# Patient Record
Sex: Female | Born: 2000 | Race: Black or African American | Hispanic: No | Marital: Single | Smoking: Never smoker
Health system: Southern US, Community
[De-identification: ages and names within clinical notes are randomized; demographics above are authoritative.]

---
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
## 2019-07-06 ENCOUNTER — Other Ambulatory Visit: Payer: Self-pay | Admitting: Nurse Practitioner

## 2019-07-06 ENCOUNTER — Other Ambulatory Visit: Payer: Self-pay | Admitting: Registered Nurse

## 2019-07-06 DIAGNOSIS — M79602 Pain in left arm: Secondary | ICD-10-CM

## 2019-07-06 DIAGNOSIS — M25512 Pain in left shoulder: Secondary | ICD-10-CM

## 2019-07-11 ENCOUNTER — Other Ambulatory Visit: Payer: Self-pay | Admitting: Nurse Practitioner

## 2019-07-17 ENCOUNTER — Inpatient Hospital Stay: Admission: RE | Admit: 2019-07-17 | Payer: BC Managed Care – PPO | Source: Ambulatory Visit

## 2019-07-17 ENCOUNTER — Inpatient Hospital Stay: Admission: RE | Admit: 2019-07-17 | Payer: BLUE CROSS/BLUE SHIELD | Source: Ambulatory Visit

## 2019-09-20 ENCOUNTER — Ambulatory Visit: Payer: BC Managed Care – PPO | Attending: Family

## 2019-09-20 DIAGNOSIS — Z23 Encounter for immunization: Secondary | ICD-10-CM

## 2019-09-20 NOTE — Progress Notes (Signed)
   Covid-19 Vaccination Clinic  Name:  Veola Cafaro    MRN: [id number] DOB: 08-Dec-2000  09/20/2019  Ms. Lavoy was observed post Covid-19 immunization for 15 minutes without incident. She was provided with Vaccine Information Sheet and instruction to access the V-Safe system.   Ms. Brandenburger was instructed to call 911 with any severe reactions post vaccine: Marland Kitchen Difficulty breathing  . Swelling of face and throat  . A fast heartbeat  . A bad rash all over body  . Dizziness and weakness   Immunizations Administered    Name Date Dose VIS Date Route   Moderna COVID-19 Vaccine 09/20/2019  1:34 PM 0.5 mL 04/2019 Intramuscular   Manufacturer: Moderna   Lot: [id number]   NDC: 37628-315-17

## 2019-10-16 ENCOUNTER — Ambulatory Visit: Payer: BC Managed Care – PPO

## 2019-10-18 ENCOUNTER — Ambulatory Visit: Payer: BC Managed Care – PPO | Attending: Family

## 2019-10-18 DIAGNOSIS — Z23 Encounter for immunization: Secondary | ICD-10-CM

## 2019-10-18 NOTE — Progress Notes (Signed)
   Covid-19 Vaccination Clinic  Name:  Colleen Turner    MRN: [id number] DOB: 2001-05-21  10/18/2019  Colleen Turner was observed post Covid-19 immunization for 15 minutes without incident. She was provided with Vaccine Information Sheet and instruction to access the V-Safe system.   Colleen Turner was instructed to call 911 with any severe reactions post vaccine: Marland Kitchen Difficulty breathing  . Swelling of face and throat  . A fast heartbeat  . A bad rash all over body  . Dizziness and weakness   Immunizations Administered    Name Date Dose VIS Date Route   Moderna COVID-19 Vaccine 10/18/2019  1:08 PM 0.5 mL 04/2019 Intramuscular   Manufacturer: Moderna   Lot: [id number]   NDC: 90211-155-20

## 2019-11-08 ENCOUNTER — Ambulatory Visit: Payer: Self-pay

## 2020-08-30 ENCOUNTER — Emergency Department (HOSPITAL_COMMUNITY): Payer: BC Managed Care – PPO | Admitting: Registered Nurse

## 2020-08-30 ENCOUNTER — Observation Stay (HOSPITAL_COMMUNITY)
Admission: EM | Admit: 2020-08-30 | Discharge: 2020-08-31 | Disposition: A | Discharge status: Home / Self Care | Payer: BC Managed Care – PPO | Attending: Surgery | Admitting: Surgery

## 2020-08-30 ENCOUNTER — Encounter (HOSPITAL_COMMUNITY): Payer: Self-pay

## 2020-08-30 ENCOUNTER — Emergency Department (HOSPITAL_COMMUNITY): Payer: BC Managed Care – PPO

## 2020-08-30 ENCOUNTER — Encounter (HOSPITAL_COMMUNITY)
Admission: EM | Disposition: A | Discharge status: Home / Self Care | Payer: Self-pay | Source: Home / Self Care | Attending: Emergency Medicine

## 2020-08-30 DIAGNOSIS — Z720 Tobacco use: Secondary | ICD-10-CM

## 2020-08-30 DIAGNOSIS — R1031 Right lower quadrant pain: Principal | ICD-10-CM | POA: Insufficient documentation

## 2020-08-30 DIAGNOSIS — F17210 Nicotine dependence, cigarettes, uncomplicated: Secondary | ICD-10-CM | POA: Diagnosis not present

## 2020-08-30 DIAGNOSIS — Z880 Allergy status to penicillin: Secondary | ICD-10-CM | POA: Insufficient documentation

## 2020-08-30 DIAGNOSIS — Z87898 Personal history of other specified conditions: Secondary | ICD-10-CM

## 2020-08-30 DIAGNOSIS — F418 Other specified anxiety disorders: Secondary | ICD-10-CM | POA: Diagnosis not present

## 2020-08-30 DIAGNOSIS — K3580 Unspecified acute appendicitis: Secondary | ICD-10-CM | POA: Diagnosis not present

## 2020-08-30 DIAGNOSIS — K3589 Other acute appendicitis without perforation or gangrene: Secondary | ICD-10-CM | POA: Diagnosis present

## 2020-08-30 DIAGNOSIS — F419 Anxiety disorder, unspecified: Secondary | ICD-10-CM

## 2020-08-30 DIAGNOSIS — Z8659 Personal history of other mental and behavioral disorders: Secondary | ICD-10-CM | POA: Diagnosis not present

## 2020-08-30 DIAGNOSIS — Z20822 Contact with and (suspected) exposure to covid-19: Secondary | ICD-10-CM | POA: Insufficient documentation

## 2020-08-30 DIAGNOSIS — Z79899 Other long term (current) drug therapy: Secondary | ICD-10-CM | POA: Insufficient documentation

## 2020-08-30 HISTORY — PX: LAPAROSCOPIC APPENDECTOMY: SHX408

## 2020-08-30 LAB — COMPREHENSIVE METABOLIC PANEL
ALT: 13 U/L (ref 0–44)
AST: 22 U/L (ref 15–41)
Albumin: 4 g/dL (ref 3.5–5.0)
Alkaline Phosphatase: 42 U/L (ref 38–126)
Anion gap: 8 (ref 5–15)
BUN: 14 mg/dL (ref 6–20)
CO2: 24 mmol/L (ref 22–32)
Calcium: 9.3 mg/dL (ref 8.9–10.3)
Chloride: 107 mmol/L (ref 98–111)
Creatinine, Ser: 0.76 mg/dL (ref 0.44–1.00)
GFR, Estimated: >60 mL/min (ref >60)
Glucose, Bld: 83 mg/dL (ref 70–99)
Potassium: 4.3 mmol/L (ref 3.5–5.1)
Sodium: 139 mmol/L (ref 135–145)
Total Bilirubin: 1 mg/dL (ref 0.3–1.2)
Total Protein: 7.4 g/dL (ref 6.5–8.1)

## 2020-08-30 LAB — CBC WITH DIFFERENTIAL/PLATELET
Abs Immature Granulocytes: 0.02 10*3/uL (ref 0.00–0.07)
Basophils Absolute: 0.1 10*3/uL (ref 0.0–0.1)
Basophils Relative: 1 %
Eosinophils Absolute: 0.2 10*3/uL (ref 0.0–0.5)
Eosinophils Relative: 3 %
HCT: 39 % (ref 36.0–46.0)
Hemoglobin: 12.4 g/dL (ref 12.0–15.0)
Immature Granulocytes: 0 %
Lymphocytes Relative: 24 %
Lymphs Abs: 1.7 10*3/uL (ref 0.7–4.0)
MCH: 23.1 pg — ABNORMAL LOW (ref 26.0–34.0)
MCHC: 31.8 g/dL (ref 30.0–36.0)
MCV: 72.6 fL — ABNORMAL LOW (ref 80.0–100.0)
Monocytes Absolute: 0.5 10*3/uL (ref 0.1–1.0)
Monocytes Relative: 6 %
Neutro Abs: 4.6 10*3/uL (ref 1.7–7.7)
Neutrophils Relative %: 66 %
Platelets: 230 10*3/uL (ref 150–400)
RBC: 5.37 MIL/uL — ABNORMAL HIGH (ref 3.87–5.11)
RDW: 13.7 % (ref 11.5–15.5)
WBC: 7 10*3/uL (ref 4.0–10.5)
nRBC: 0 % (ref 0.0–0.2)

## 2020-08-30 LAB — URINALYSIS, ROUTINE W REFLEX MICROSCOPIC
Bilirubin Urine: NEGATIVE
Glucose, UA: NEGATIVE mg/dL
Hgb urine dipstick: NEGATIVE
Ketones, ur: 80 mg/dL — AB
Leukocytes,Ua: NEGATIVE
Nitrite: NEGATIVE
Protein, ur: NEGATIVE mg/dL
Specific Gravity, Urine: 1.033 — ABNORMAL HIGH (ref 1.005–1.030)
pH: 5 (ref 5.0–8.0)

## 2020-08-30 LAB — I-STAT BETA HCG BLOOD, ED (MC, WL, AP ONLY): I-stat hCG, quantitative: <5 m[IU]/mL (ref <5)

## 2020-08-30 LAB — RESP PANEL BY RT-PCR (FLU A&B, COVID) ARPGX2
Influenza A by PCR: NEGATIVE
Influenza B by PCR: NEGATIVE
SARS Coronavirus 2 by RT PCR: NEGATIVE

## 2020-08-30 SURGERY — APPENDECTOMY, LAPAROSCOPIC
Anesthesia: General

## 2020-08-30 MED ORDER — GENTAMICIN SULFATE 40 MG/ML IJ SOLN
5.0000 mg/kg | INTRAVENOUS | Status: AC
  Administered 2020-08-30: 350 mg via INTRAVENOUS
  Filled 2020-08-30: qty 8.75

## 2020-08-30 MED ORDER — CELECOXIB 200 MG PO CAPS: 200.0000 mg | ORAL_CAPSULE | ORAL | Status: DC

## 2020-08-30 MED ORDER — MAGIC MOUTHWASH
15.0000 mL | Freq: Four times a day (QID) | ORAL | Status: DC | PRN
  Filled 2020-08-30: qty 15

## 2020-08-30 MED ORDER — OXYCODONE HCL 5 MG/5ML PO SOLN
5.0000 mg | Freq: Once | ORAL | Status: DC | PRN
Start: 2020-08-30 — End: 2020-08-30

## 2020-08-30 MED ORDER — FENTANYL CITRATE (PF) 250 MCG/5ML IJ SOLN
INTRAMUSCULAR | Status: AC
  Filled 2020-08-30: qty 5

## 2020-08-30 MED ORDER — CHLORHEXIDINE GLUCONATE CLOTH 2 % EX PADS: 6.0000 | MEDICATED_PAD | Freq: Once | CUTANEOUS | Status: DC

## 2020-08-30 MED ORDER — TRAMADOL HCL 50 MG PO TABS
50.0000 mg | ORAL_TABLET | Freq: Four times a day (QID) | ORAL | Status: DC | PRN
  Administered 2020-08-31 (×3): 50 mg via ORAL
  Filled 2020-08-30 (×3): qty 1

## 2020-08-30 MED ORDER — PHENOL 1.4 % MT LIQD
2.0000 | OROMUCOSAL | Status: DC | PRN
  Filled 2020-08-30: qty 177

## 2020-08-30 MED ORDER — LIDOCAINE 2% (20 MG/ML) 5 ML SYRINGE
INTRAMUSCULAR | Status: DC | PRN
  Administered 2020-08-30: 40 mg via INTRAVENOUS
  Administered 2020-08-30: 50 mg via INTRAVENOUS

## 2020-08-30 MED ORDER — BUPIVACAINE-EPINEPHRINE 0.25% -1:200000 IJ SOLN
INTRAMUSCULAR | Status: DC | PRN
  Administered 2020-08-30: 30 mL

## 2020-08-30 MED ORDER — DIPHENHYDRAMINE HCL 12.5 MG/5ML PO ELIX: 12.5000 mg | ORAL_SOLUTION | Freq: Four times a day (QID) | ORAL | Status: DC | PRN

## 2020-08-30 MED ORDER — FENTANYL CITRATE (PF) 250 MCG/5ML IJ SOLN
INTRAMUSCULAR | Status: DC | PRN
  Administered 2020-08-30 (×2): 25 ug via INTRAVENOUS
  Administered 2020-08-30 (×2): 100 ug via INTRAVENOUS

## 2020-08-30 MED ORDER — BUPIVACAINE LIPOSOME 1.3 % IJ SUSP
20.0000 mL | Freq: Once | INTRAMUSCULAR | Status: DC
  Filled 2020-08-30: qty 20

## 2020-08-30 MED ORDER — METRONIDAZOLE IN NACL 5-0.79 MG/ML-% IV SOLN
500.0000 mg | Freq: Three times a day (TID) | INTRAVENOUS | Status: DC
  Administered 2020-08-31 (×2): 500 mg via INTRAVENOUS
  Filled 2020-08-30 (×2): qty 100

## 2020-08-30 MED ORDER — SUGAMMADEX SODIUM 200 MG/2ML IV SOLN
INTRAVENOUS | Status: DC | PRN
  Administered 2020-08-30: 200 mg via INTRAVENOUS

## 2020-08-30 MED ORDER — ONDANSETRON HCL 4 MG/2ML IJ SOLN
4.0000 mg | Freq: Once | INTRAMUSCULAR | Status: AC
  Administered 2020-08-30: 4 mg via INTRAVENOUS
  Filled 2020-08-30: qty 2

## 2020-08-30 MED ORDER — CELECOXIB 200 MG PO CAPS
200.0000 mg | ORAL_CAPSULE | ORAL | Status: AC
  Administered 2020-08-30: 200 mg via ORAL
  Filled 2020-08-30: qty 1

## 2020-08-30 MED ORDER — DEXAMETHASONE SODIUM PHOSPHATE 10 MG/ML IJ SOLN
INTRAMUSCULAR | Status: DC | PRN
  Administered 2020-08-30: 5 mg via INTRAVENOUS

## 2020-08-30 MED ORDER — METRONIDAZOLE IN NACL 5-0.79 MG/ML-% IV SOLN
500.0000 mg | Freq: Once | INTRAVENOUS | Status: AC
  Administered 2020-08-30: 500 mg via INTRAVENOUS
  Filled 2020-08-30: qty 100

## 2020-08-30 MED ORDER — LACTATED RINGERS IV BOLUS: 1000.0000 mL | Freq: Three times a day (TID) | INTRAVENOUS | Status: DC | PRN

## 2020-08-30 MED ORDER — CALCIUM POLYCARBOPHIL 625 MG PO TABS
625.0000 mg | ORAL_TABLET | Freq: Two times a day (BID) | ORAL | Status: DC
  Administered 2020-08-31 (×2): 625 mg via ORAL
  Filled 2020-08-30 (×2): qty 1

## 2020-08-30 MED ORDER — MORPHINE SULFATE (PF) 4 MG/ML IV SOLN
4.0000 mg | Freq: Once | INTRAVENOUS | Status: AC
  Administered 2020-08-30: 4 mg via INTRAVENOUS
  Filled 2020-08-30: qty 1

## 2020-08-30 MED ORDER — ACETAMINOPHEN 500 MG PO TABS: 1000.0000 mg | ORAL_TABLET | ORAL | Status: DC

## 2020-08-30 MED ORDER — CLINDAMYCIN PHOSPHATE 900 MG/50ML IV SOLN: 900.0000 mg | INTRAVENOUS | Status: DC

## 2020-08-30 MED ORDER — MIDAZOLAM HCL 5 MG/5ML IJ SOLN
INTRAMUSCULAR | Status: DC | PRN
  Administered 2020-08-30: 2 mg via INTRAVENOUS

## 2020-08-30 MED ORDER — SODIUM CHLORIDE 0.9% FLUSH: 3.0000 mL | INTRAVENOUS | Status: DC | PRN

## 2020-08-30 MED ORDER — LACTATED RINGERS IV SOLN: INTRAVENOUS | Status: DC

## 2020-08-30 MED ORDER — LACTATED RINGERS IV BOLUS
1000.0000 mL | Freq: Once | INTRAVENOUS | Status: AC
  Administered 2020-08-30: 1000 mL via INTRAVENOUS

## 2020-08-30 MED ORDER — ACETAMINOPHEN 10 MG/ML IV SOLN
INTRAVENOUS | Status: AC
  Filled 2020-08-30: qty 100

## 2020-08-30 MED ORDER — FENTANYL CITRATE (PF) 100 MCG/2ML IJ SOLN
50.0000 ug | Freq: Once | INTRAMUSCULAR | Status: AC
  Administered 2020-08-30: 50 ug via INTRAVENOUS
  Filled 2020-08-30 (×2): qty 2

## 2020-08-30 MED ORDER — BUPIVACAINE LIPOSOME 1.3 % IJ SUSP
INTRAMUSCULAR | Status: DC | PRN
  Administered 2020-08-30: 20 mL

## 2020-08-30 MED ORDER — ACETAMINOPHEN 500 MG PO TABS
1000.0000 mg | ORAL_TABLET | Freq: Four times a day (QID) | ORAL | Status: DC
  Administered 2020-08-31 (×3): 1000 mg via ORAL
  Filled 2020-08-30 (×3): qty 2

## 2020-08-30 MED ORDER — CHLORHEXIDINE GLUCONATE CLOTH 2 % EX PADS
6.0000 | MEDICATED_PAD | Freq: Once | CUTANEOUS | Status: AC
  Administered 2020-08-30: 6 via TOPICAL

## 2020-08-30 MED ORDER — IOHEXOL 300 MG/ML  SOLN
100.0000 mL | Freq: Once | INTRAMUSCULAR | Status: AC | PRN
  Administered 2020-08-30: 100 mL via INTRAVENOUS

## 2020-08-30 MED ORDER — ONDANSETRON HCL 4 MG/2ML IJ SOLN
INTRAMUSCULAR | Status: AC
  Filled 2020-08-30: qty 2

## 2020-08-30 MED ORDER — PROPOFOL 10 MG/ML IV BOLUS
INTRAVENOUS | Status: DC | PRN
  Administered 2020-08-30: 20 mg via INTRAVENOUS
  Administered 2020-08-30: 110 mg via INTRAVENOUS

## 2020-08-30 MED ORDER — GABAPENTIN 300 MG PO CAPS
300.0000 mg | ORAL_CAPSULE | Freq: Two times a day (BID) | ORAL | Status: DC
  Administered 2020-08-31 (×2): 300 mg via ORAL
  Filled 2020-08-30 (×2): qty 1

## 2020-08-30 MED ORDER — DIPHENHYDRAMINE HCL 50 MG/ML IJ SOLN: 12.5000 mg | Freq: Four times a day (QID) | INTRAMUSCULAR | Status: DC | PRN

## 2020-08-30 MED ORDER — ACETAMINOPHEN 10 MG/ML IV SOLN: 1000.0000 mg | Freq: Once | INTRAVENOUS | Status: DC | PRN

## 2020-08-30 MED ORDER — GABAPENTIN 300 MG PO CAPS
300.0000 mg | ORAL_CAPSULE | ORAL | Status: AC
  Administered 2020-08-30: 300 mg via ORAL
  Filled 2020-08-30: qty 1

## 2020-08-30 MED ORDER — ACETAMINOPHEN 500 MG PO TABS
1000.0000 mg | ORAL_TABLET | ORAL | Status: AC
  Administered 2020-08-30: 1000 mg via ORAL
  Filled 2020-08-30: qty 2

## 2020-08-30 MED ORDER — PROMETHAZINE HCL 25 MG/ML IJ SOLN
6.2500 mg | Freq: Once | INTRAMUSCULAR | Status: AC
  Administered 2020-08-30: 6.25 mg via INTRAVENOUS

## 2020-08-30 MED ORDER — LACTATED RINGERS IV SOLN: INTRAVENOUS | Status: DC | PRN

## 2020-08-30 MED ORDER — FENTANYL CITRATE (PF) 100 MCG/2ML IJ SOLN
INTRAMUSCULAR | Status: AC
  Administered 2020-08-30: 25 ug via INTRAVENOUS
  Filled 2020-08-30: qty 2

## 2020-08-30 MED ORDER — MAGNESIUM HYDROXIDE 400 MG/5ML PO SUSP: 30.0000 mL | Freq: Every day | ORAL | Status: DC | PRN

## 2020-08-30 MED ORDER — MIDAZOLAM HCL 2 MG/2ML IJ SOLN
INTRAMUSCULAR | Status: AC
  Filled 2020-08-30: qty 2

## 2020-08-30 MED ORDER — ACETAMINOPHEN 500 MG PO TABS: 1000.0000 mg | ORAL_TABLET | Freq: Once | ORAL | Status: DC | PRN

## 2020-08-30 MED ORDER — ACETAMINOPHEN 160 MG/5ML PO SOLN: 1000.0000 mg | Freq: Once | ORAL | Status: DC | PRN

## 2020-08-30 MED ORDER — FENTANYL CITRATE (PF) 100 MCG/2ML IJ SOLN
25.0000 ug | INTRAMUSCULAR | Status: DC | PRN
  Administered 2020-08-30: 25 ug via INTRAVENOUS
  Administered 2020-08-30: 50 ug via INTRAVENOUS

## 2020-08-30 MED ORDER — BISACODYL 10 MG RE SUPP: 10.0000 mg | Freq: Every day | RECTAL | Status: DC | PRN

## 2020-08-30 MED ORDER — ACETAMINOPHEN 500 MG PO TABS
1000.0000 mg | ORAL_TABLET | Freq: Once | ORAL | Status: AC
  Administered 2020-08-30: 1000 mg via ORAL
  Filled 2020-08-30: qty 2

## 2020-08-30 MED ORDER — ENOXAPARIN SODIUM 40 MG/0.4ML ~~LOC~~ SOLN
40.0000 mg | SUBCUTANEOUS | Status: DC
  Administered 2020-08-31: 40 mg via SUBCUTANEOUS
  Filled 2020-08-30: qty 0.4

## 2020-08-30 MED ORDER — METOPROLOL TARTRATE 5 MG/5ML IV SOLN
5.0000 mg | Freq: Four times a day (QID) | INTRAVENOUS | Status: DC | PRN
Start: 2020-08-30 — End: 2020-08-31
  Filled 2020-08-30: qty 5

## 2020-08-30 MED ORDER — MENTHOL 3 MG MT LOZG: 1.0000 | LOZENGE | OROMUCOSAL | Status: DC | PRN

## 2020-08-30 MED ORDER — BUPIVACAINE-EPINEPHRINE (PF) 0.25% -1:200000 IJ SOLN
INTRAMUSCULAR | Status: AC
  Filled 2020-08-30: qty 30

## 2020-08-30 MED ORDER — ALUM & MAG HYDROXIDE-SIMETH 200-200-20 MG/5ML PO SUSP: 30.0000 mL | Freq: Four times a day (QID) | ORAL | Status: DC | PRN

## 2020-08-30 MED ORDER — METHOCARBAMOL 500 MG PO TABS
1000.0000 mg | ORAL_TABLET | Freq: Four times a day (QID) | ORAL | Status: DC | PRN
  Administered 2020-08-31: 1000 mg via ORAL
  Filled 2020-08-30: qty 2

## 2020-08-30 MED ORDER — METHOCARBAMOL 1000 MG/10ML IJ SOLN
1000.0000 mg | Freq: Four times a day (QID) | INTRAVENOUS | Status: DC | PRN
  Filled 2020-08-30: qty 10

## 2020-08-30 MED ORDER — OXYCODONE HCL 5 MG PO TABS: 5.0000 mg | ORAL_TABLET | Freq: Once | ORAL | Status: DC | PRN

## 2020-08-30 MED ORDER — METHYLPHENIDATE HCL ER (XR) 10 MG PO CP24: 10.0000 mg | ORAL_CAPSULE | Freq: Every day | ORAL | Status: DC

## 2020-08-30 MED ORDER — LIP MEDEX EX OINT
1.0000 "application " | TOPICAL_OINTMENT | Freq: Two times a day (BID) | CUTANEOUS | Status: DC
  Administered 2020-08-31 (×2): 1 via TOPICAL
  Filled 2020-08-30: qty 7

## 2020-08-30 MED ORDER — SIMETHICONE 40 MG/0.6ML PO SUSP
80.0000 mg | Freq: Four times a day (QID) | ORAL | Status: DC | PRN
  Filled 2020-08-30 (×2): qty 1.2

## 2020-08-30 MED ORDER — CIPROFLOXACIN IN D5W 400 MG/200ML IV SOLN
400.0000 mg | Freq: Two times a day (BID) | INTRAVENOUS | Status: DC
  Administered 2020-08-31: 400 mg via INTRAVENOUS
  Filled 2020-08-30: qty 200

## 2020-08-30 MED ORDER — SODIUM CHLORIDE 0.9 % IV SOLN: 250.0000 mL | INTRAVENOUS | Status: DC | PRN

## 2020-08-30 MED ORDER — ONDANSETRON HCL 4 MG/2ML IJ SOLN
INTRAMUSCULAR | Status: DC | PRN
  Administered 2020-08-30: 4 mg via INTRAVENOUS

## 2020-08-30 MED ORDER — CIPROFLOXACIN IN D5W 400 MG/200ML IV SOLN
400.0000 mg | Freq: Once | INTRAVENOUS | Status: AC
  Administered 2020-08-30: 400 mg via INTRAVENOUS
  Filled 2020-08-30: qty 200

## 2020-08-30 MED ORDER — PROPOFOL 10 MG/ML IV BOLUS
INTRAVENOUS | Status: AC
  Filled 2020-08-30: qty 20

## 2020-08-30 MED ORDER — PROCHLORPERAZINE EDISYLATE 10 MG/2ML IJ SOLN
5.0000 mg | INTRAMUSCULAR | Status: DC | PRN
  Administered 2020-08-31: 10 mg via INTRAVENOUS
  Filled 2020-08-30: qty 2

## 2020-08-30 MED ORDER — DEXAMETHASONE SODIUM PHOSPHATE 10 MG/ML IJ SOLN
INTRAMUSCULAR | Status: AC
  Filled 2020-08-30: qty 1

## 2020-08-30 MED ORDER — ROCURONIUM BROMIDE 10 MG/ML (PF) SYRINGE
PREFILLED_SYRINGE | INTRAVENOUS | Status: DC | PRN
  Administered 2020-08-30: 50 mg via INTRAVENOUS

## 2020-08-30 MED ORDER — SODIUM CHLORIDE 0.9% FLUSH
3.0000 mL | Freq: Two times a day (BID) | INTRAVENOUS | Status: DC
  Administered 2020-08-31: 3 mL via INTRAVENOUS

## 2020-08-30 MED ORDER — PROMETHAZINE HCL 25 MG/ML IJ SOLN
INTRAMUSCULAR | Status: AC
  Filled 2020-08-30: qty 1

## 2020-08-30 SURGICAL SUPPLY — 49 items
APPLIER CLIP 5 13 M/L LIGAMAX5 (MISCELLANEOUS)
APPLIER CLIP ROT 10 11.4 M/L (STAPLE)
CABLE HIGH FREQUENCY MONO STRZ (ELECTRODE) ×2 IMPLANT
CHLORAPREP W/TINT 26 (MISCELLANEOUS) ×2 IMPLANT
CLIP APPLIE 5 13 M/L LIGAMAX5 (MISCELLANEOUS) IMPLANT
CLIP APPLIE ROT 10 11.4 M/L (STAPLE) IMPLANT
COVER SURGICAL LIGHT HANDLE (MISCELLANEOUS) ×2 IMPLANT
COVER WAND RF STERILE (DRAPES) IMPLANT
DECANTER SPIKE VIAL GLASS SM (MISCELLANEOUS) ×2 IMPLANT
DEVICE TROCAR PUNCTURE CLOSURE (ENDOMECHANICALS) IMPLANT
DRAPE LAPAROSCOPIC ABDOMINAL (DRAPES) ×2 IMPLANT
DRAPE WARM FLUID 44X44 (DRAPES) ×2 IMPLANT
DRSG TEGADERM 2-3/8X2-3/4 SM (GAUZE/BANDAGES/DRESSINGS) ×4 IMPLANT
DRSG TEGADERM 4X4.75 (GAUZE/BANDAGES/DRESSINGS) ×2 IMPLANT
ELECT REM PT RETURN 15FT ADLT (MISCELLANEOUS) ×2 IMPLANT
ENDOLOOP SUT PDS II  0 18 (SUTURE)
ENDOLOOP SUT PDS II 0 18 (SUTURE) IMPLANT
GAUZE SPONGE 2X2 8PLY STRL LF (GAUZE/BANDAGES/DRESSINGS) ×1 IMPLANT
GLOVE SURG LTX SZ8 (GLOVE) ×2 IMPLANT
GLOVE SURG UNDER LTX SZ8 (GLOVE) ×2 IMPLANT
GOWN STRL REUS W/TWL XL LVL3 (GOWN DISPOSABLE) ×4 IMPLANT
IRRIG SUCT STRYKERFLOW 2 WTIP (MISCELLANEOUS) ×2
IRRIGATION SUCT STRKRFLW 2 WTP (MISCELLANEOUS) ×1 IMPLANT
KIT BASIN OR (CUSTOM PROCEDURE TRAY) ×2 IMPLANT
KIT TURNOVER KIT A (KITS) ×2 IMPLANT
PAD POSITIONING PINK XL (MISCELLANEOUS) ×2 IMPLANT
PENCIL SMOKE EVACUATOR (MISCELLANEOUS) IMPLANT
POUCH RETRIEVAL ECOSAC 10 (ENDOMECHANICALS) ×1 IMPLANT
POUCH RETRIEVAL ECOSAC 10MM (ENDOMECHANICALS) ×1
RELOAD STAPLER BLUE 60MM (STAPLE) ×1 IMPLANT
RELOAD STAPLER GREEN 60MM (STAPLE) IMPLANT
SCISSORS LAP 5X35 DISP (ENDOMECHANICALS) ×2 IMPLANT
SET TUBE SMOKE EVAC HIGH FLOW (TUBING) ×2 IMPLANT
SHEARS HARMONIC ACE PLUS 36CM (ENDOMECHANICALS) IMPLANT
SLEEVE XCEL OPT CAN 5 100 (ENDOMECHANICALS) ×2 IMPLANT
SPONGE GAUZE 2X2 STER 10/PKG (GAUZE/BANDAGES/DRESSINGS) ×1
STAPLE ECHEON FLEX 60 POW ENDO (STAPLE) ×2 IMPLANT
STAPLER RELOAD BLUE 60MM (STAPLE) ×2
STAPLER RELOAD GREEN 60MM (STAPLE)
SUT MNCRL AB 4-0 PS2 18 (SUTURE) ×2 IMPLANT
SUT PDS AB 0 CT1 36 (SUTURE) IMPLANT
SUT PDS AB 1 CT1 27 (SUTURE) ×2 IMPLANT
SUT SILK 2 0 SH (SUTURE) IMPLANT
TOWEL OR 17X26 10 PK STRL BLUE (TOWEL DISPOSABLE) ×2 IMPLANT
TRAY FOLEY MTR SLVR 14FR STAT (SET/KITS/TRAYS/PACK) ×2 IMPLANT
TRAY FOLEY MTR SLVR 16FR STAT (SET/KITS/TRAYS/PACK) IMPLANT
TRAY LAPAROSCOPIC (CUSTOM PROCEDURE TRAY) ×2 IMPLANT
TROCAR BLADELESS OPT 5 100 (ENDOMECHANICALS) ×2 IMPLANT
TROCAR XCEL 12X100 BLDLESS (ENDOMECHANICALS) ×2 IMPLANT

## 2020-08-30 NOTE — ED Notes (Signed)
Mother called information provided to provider for updates

## 2020-08-30 NOTE — ED Provider Notes (Signed)
Physical Exam  BP 107/76 (BP Location: Left Arm)   Pulse 89   Temp 98.3 F (36.8 C) (Oral)   Resp 16   Ht 5\' 9"  (1.753 m)   Wt 70.3 kg   LMP 08/13/2020 (Approximate) Comment: negative upreg.  SpO2 100%   BMI 22.89 kg/m   Physical Exam Vitals and nursing note reviewed.  HENT:     Head: Normocephalic and atraumatic.     Mouth/Throat:     Mouth: Mucous membranes are moist.     Pharynx: No oropharyngeal exudate or posterior oropharyngeal erythema.  Eyes:     General:        Right eye: No discharge.        Left eye: No discharge.     Conjunctiva/sclera: Conjunctivae normal.  Cardiovascular:     Rate and Rhythm: Normal rate and regular rhythm.     Pulses: Normal pulses.  Pulmonary:     Effort: Pulmonary effort is normal. No respiratory distress.     Breath sounds: Normal breath sounds. No wheezing or rales.  Abdominal:     General: Bowel sounds are normal. There is no distension.     Palpations: Abdomen is soft.     Tenderness: There is abdominal tenderness in the right lower quadrant and suprapubic area. There is guarding. There is no right CVA tenderness, left CVA tenderness or rebound. Positive signs include Rovsing's sign, McBurney's sign and obturator sign. Negative signs include Murphy's sign.  Musculoskeletal:        General: No deformity.     Cervical back: Neck supple.  Skin:    General: Skin is warm and dry.  Neurological:     Mental Status: She is alert. Mental status is at baseline.  Psychiatric:        Mood and Affect: Mood normal.     ED Course/Procedures   Clinical Course as of 08/30/20 1936  Sat Aug 30, 2020  1442 Spoke with patient's mother Dr. Jadore Turner who is in [place].  Updated her on plan of care and pending CT scan. [CG]  1640 CT with acute appendicitis, general surgery consulted.  [RS]  1707 Consult to general surgery, Dr. [place], who is agreeable to plan for IV antibiotics. He is currently performing a case at [facility], but will evaluate the  patient when he is available. I appreciate his collaboration in the care of this patient.  [RS]    Clinical Course User Index [CG] Colleen Turner, Colleen Turner [RS] Colleen Turner, Colleen Turner, Colleen Turner    Procedures  MDM    Care of this patient assumed from preceding ED provider, Colleen Turner, Colleen Turner at the time of shift change.  Please see her associated note for further insight to the patient's ED course.  In brief patient is a 76 female with progressively worsening right lower abdominal pain x1 week with associated nausea.  She denies fevers or chills at home.   CBC without leukocytosis, UA unremarkable, CMP unremarkable, patient is not pregnant.  At time of shift change patient is awaiting CT of the abdomen pelvis for further evaluation.  Patient with right lower quadrant tenderness palpation of McBurney's point with positive Rovsing and psoas signs.  Guarding.    CT abdomen pelvis revealed acute appendicitis without evidence of perforation or abscess.  Will start on IV antibiotics and contact general surgery.  General surgery consulted, they will evaluate her in the emergency department once he is finished with his case.  COVID-19 test pending, analgesia ordered.  Colleen Turner  voiced understanding her medical evaluation and treatment plan.  Each of her questions was answered to her expressed satisfaction.  General surgery has evaluated the patient and will take her to the OR tonight. Patient aware. She remains stable at this time.   This chart was dictated using voice recognition software, Dragon. Despite the best efforts of this provider to proofread and correct errors, errors may still occur which can change documentation meaning.      Colleen Turner 08/30/20 1936    Colleen Kaplan, MD 09/02/20 1549

## 2020-08-30 NOTE — Discharge Instructions (Signed)
SURGERY: POST OP INSTRUCTIONS (Surgery for small bowel obstruction, colon resection, etc)   ######################################################################  EAT Gradually transition to a high fiber diet with a fiber supplement over the next few days after discharge  WALK Walk an hour a day.  Control your pain to do that.    CONTROL PAIN Control pain so that you can walk, sleep, tolerate sneezing/coughing, go up/down stairs.  HAVE A BOWEL MOVEMENT DAILY Keep your bowels regular to avoid problems.  OK to try a laxative to override constipation.  OK to use an antidairrheal to slow down diarrhea.  Call if not better after 2 tries  CALL IF YOU HAVE PROBLEMS/CONCERNS Call if you are still struggling despite following these instructions. Call if you have concerns not answered by these instructions  ######################################################################   DIET Follow a light diet the first few days at home.  Start with a bland diet such as soups, liquids, starchy foods, low fat foods, etc.  If you feel full, bloated, or constipated, stay on a ful liquid or pureed/blenderized diet for a few days until you feel better and no longer constipated. Be sure to drink plenty of fluids every day to avoid getting dehydrated (feeling dizzy, not urinating, etc.). Gradually add a fiber supplement to your diet over the next week.  Gradually get back to a regular solid diet.  Avoid fast food or heavy meals the first week as you are more likely to get nauseated. It is expected for your digestive tract to need a few months to get back to normal.  It is common for your bowel movements and stools to be irregular.  You will have occasional bloating and cramping that should eventually fade away.  Until you are eating solid food normally, off all pain medications, and back to regular activities; your bowels will not be normal. Focus on eating a low-fat, high fiber diet the rest of your life  (See Getting to Good Bowel Health, below).  CARE of your INCISION or WOUND  It is good for closed incisions and even open wounds to be washed every day.  Shower every day.  Short baths are fine.  Wash the incisions and wounds clean with soap & water.    You may leave closed incisions open to air if it is dry.   You may cover the incision with clean gauze & replace it after your daily shower for comfort.  TEGADERM:  You have clear gauze band-aid dressings over your closed incision(s).  Remove the dressings 3 days after surgery.  If you have an open wound with a wound vac, see wound vac care instructions.     ACTIVITIES as tolerated Start light daily activities --- self-care, walking, climbing stairs-- beginning the day after surgery.  Gradually increase activities as tolerated.  Control your pain to be active.  Stop when you are tired.  Ideally, walk several times a day, eventually an hour a day.   Most people are back to most day-to-day activities in a few weeks.  It takes 4-8 weeks to get back to unrestricted, intense activity. If you can walk 30 minutes without difficulty, it is safe to try more intense activity such as jogging, treadmill, bicycling, low-impact aerobics, swimming, etc. Save the most intensive and strenuous activity for last (Usually 4-8 weeks after surgery) such as sit-ups, heavy lifting, contact sports, etc.  Refrain from any intense heavy lifting or straining until you are off narcotics for pain control.  You will have off days, but things  should improve week-by-week. DO NOT PUSH THROUGH PAIN.  Let pain be your guide: If it hurts to do something, don't do it.  Pain is your body warning you to avoid that activity for another week until the pain goes down. You may drive when you are no longer taking narcotic prescription pain medication, you can comfortably wear a seatbelt, and you can safely make sudden turns/stops to protect yourself without hesitating due to pain. You may  have sexual intercourse when it is comfortable. If it hurts to do something, stop.  MEDICATIONS Take your usually prescribed home medications unless otherwise directed.   Blood thinners:  Usually you can restart any strong blood thinners after the second postoperative day.  It is OK to take aspirin right away.     If you are on strong blood thinners (warfarin/Coumadin, Plavix, Xerelto, Eliquis, Pradaxa, etc), discuss with your surgeon, medicine PCP, and/or cardiologist for instructions on when to restart the blood thinner & if blood monitoring is needed (PT/INR blood check, etc).     PAIN CONTROL Pain after surgery or related to activity is often due to strain/injury to muscle, tendon, nerves and/or incisions.  This pain is usually short-term and will improve in a few months.  To help speed the process of healing and to get back to regular activity more quickly, DO THE FOLLOWING THINGS TOGETHER: 1. Increase activity gradually.  DO NOT PUSH THROUGH PAIN 2. Use Ice and/or Heat 3. Try Gentle Massage and/or Stretching 4. Take over the counter pain medication 5. Take Narcotic prescription pain medication for more severe pain  Good pain control = faster recovery.  It is better to take more medicine to be more active than to stay in bed all day to avoid medications. 1.  Increase activity gradually Avoid heavy lifting at first, then increase to lifting as tolerated over the next 6 weeks. Do not "push through" the pain.  Listen to your body and avoid positions and maneuvers than reproduce the pain.  Wait a few days before trying something more intense Walking an hour a day is encouraged to help your body recover faster and more safely.  Start slowly and stop when getting sore.  If you can walk 30 minutes without stopping or pain, you can try more intense activity (running, jogging, aerobics, cycling, swimming, treadmill, sex, sports, weightlifting, etc.) Remember: If it hurts to do it, then don't do  it! 2. Use Ice and/or Heat You will have swelling and bruising around the incisions.  This will take several weeks to resolve. Ice packs or heating pads (6-8 times a day, 30-60 minutes at a time) will help sooth soreness & bruising. Some people prefer to use ice alone, heat alone, or alternate between ice & heat.  Experiment and see what works best for you.  Consider trying ice for the first few days to help decrease swelling and bruising; then, switch to heat to help relax sore spots and speed recovery. Shower every day.  Short baths are fine.  It feels good!  Keep the incisions and wounds clean with soap & water.   3. Try Gentle Massage and/or Stretching Massage at the area of pain many times a day Stop if you feel pain - do not overdo it 4. Take over the counter pain medication This helps the muscle and nerve tissues become less irritable and calm down faster Choose ONE of the following over-the-counter anti-inflammatory medications: Acetaminophen  tabs (Tylenol) 1-2 pills with every meal and just before  bedtime (avoid if you have liver problems or if you have acetaminophen in you narcotic prescription) Naproxen 220mg  tabs (ex. Aleve, Naprosyn) 1-2 pills twice a day (avoid if you have kidney, stomach, IBD, or bleeding problems) Ibuprofen 200mg  tabs (ex. Advil, Motrin) 3-4 pills with every meal and just before bedtime (avoid if you have kidney, stomach, IBD, or bleeding problems) Take with food/snack several times a day as directed for at least 2 weeks to help keep pain / soreness down & more manageable. 5. Take Narcotic prescription pain medication for more severe pain A prescription for strong pain control is often given to you upon discharge (for example: oxycodone/Percocet, hydrocodone/Norco/Vicodin, or tramadol/Ultram) Take your pain medication as prescribed. Be mindful that most narcotic prescriptions contain Tylenol (acetaminophen) as well - avoid taking too much Tylenol. If you are  having problems/concerns with the prescription medicine (does not control pain, nausea, vomiting, rash, itching, etc.), please call [phone] to see if we need to switch you to a different pain medicine that will work better for you and/or control your side effects better. If you need a refill on your pain medication, you must call the office before 4 pm and on weekdays only.  By federal law, prescriptions for narcotics cannot be called into a pharmacy.  They must be filled out on paper & picked up from our office by the patient or authorized caretaker.  Prescriptions cannot be filled after 4 pm nor on weekends.    WHEN TO CALL us [phone] Severe uncontrolled or worsening pain  Fever over 101 F (38.5 C) Concerns with the incision: Worsening pain, redness, rash/hives, swelling, bleeding, or drainage Reactions / problems with new medications (itching, rash, hives, nausea, etc.) Nausea and/or vomiting Difficulty urinating Difficulty breathing Worsening fatigue, dizziness, lightheadedness, blurred vision Other concerns If you are not getting better after two weeks or are noticing you are getting worse, contact our office [phone] for further advice.  We may need to adjust your medications, re-evaluate you in the office, send you to the emergency room, or see what other things we can do to help. The clinic staff is available to answer your questions during regular business hours (8:30am-5pm).  Please don't hesitate to call and ask to speak to one of our nurses for clinical concerns.    A surgeon from [facility] is always on call at the hospitals 24 hours/day If you have a medical emergency, go to the nearest emergency room or call 911.  FOLLOW UP in our office One the day of your discharge from the hospital (or the next business weekday), please call [facility] [phone] Surgery to set up or confirm an appointment to see your surgeon in the office for a follow-up  appointment.  Usually it is 2-3 weeks after your surgery.   If you have skin staples at your incision(s), let the office know so we can set up a time in the office for the nurse to remove them (usually around 10 days after surgery). Make sure that you call for appointments the day of discharge (or the next business weekday) from the hospital to ensure a convenient appointment time. IF YOU HAVE DISABILITY OR FAMILY LEAVE FORMS, BRING THEM TO THE OFFICE FOR PROCESSING.  DO NOT GIVE THEM TO YOUR DOCTOR.  [facility] [address], [place], [place]  [place] ? [phone] - Main [phone] - Toll Free,  [phone] - Fax [email or web address]  GETTING TO GOOD  BOWEL HEALTH. It is expected for your digestive tract to need a few months to get back to normal.  It is common for your bowel movements and stools to be irregular.  You will have occasional bloating and cramping that should eventually fade away.  Until you are eating solid food normally, off all pain medications, and back to regular activities; your bowels will not be normal.   Avoiding constipation The goal: ONE SOFT BOWEL MOVEMENT A DAY!    Drink plenty of fluids.  Choose water first. TAKE A FIBER SUPPLEMENT EVERY DAY THE REST OF YOUR LIFE During your first week back home, gradually add back a fiber supplement every day Experiment which form you can tolerate.   There are many forms such as powders, tablets, wafers, gummies, etc Psyllium bran (Metamucil), methylcellulose (Citrucel), Miralax or Glycolax, Benefiber, Flax Seed.  Adjust the dose week-by-week (1/2 dose/day to 6 doses a day) until you are moving your bowels 1-2 times a day.  Cut back the dose or try a different fiber product if it is giving you problems such as diarrhea or bloating. Sometimes a laxative is needed to help jump-start bowels if constipated until the fiber supplement can help regulate your bowels.  If you are  tolerating eating & you are farting, it is okay to try a gentle laxative such as double dose MiraLax, prune juice, or Milk of Magnesia.  Avoid using laxatives too often. Stool softeners can sometimes help counteract the constipating effects of narcotic pain medicines.  It can also cause diarrhea, so avoid using for too long. If you are still constipated despite taking fiber daily, eating solids, and a few doses of laxatives, call our office. Controlling diarrhea Try drinking liquids and eating bland foods for a few days to avoid stressing your intestines further. Avoid dairy products (especially milk & ice cream) for a short time.  The intestines often can lose the ability to digest lactose when stressed. Avoid foods that cause gassiness or bloating.  Typical foods include beans and other legumes, cabbage, broccoli, and dairy foods.  Avoid greasy, spicy, fast foods.  Every person has some sensitivity to other foods, so listen to your body and avoid those foods that trigger problems for you. Probiotics (such as active yogurt, Align, etc) may help repopulate the intestines and colon with normal bacteria and calm down a sensitive digestive tract Adding a fiber supplement gradually can help thicken stools by absorbing excess fluid and retrain the intestines to act more normally.  Slowly increase the dose over a few weeks.  Too much fiber too soon can backfire and cause cramping & bloating. It is okay to try and slow down diarrhea with a few doses of antidiarrheal medicines.   Bismuth subsalicylate (ex. Kayopectate, Pepto Bismol) for a few doses can help control diarrhea.  Avoid if pregnant.   Loperamide (Imodium) can slow down diarrhea.  Start with one tablet (2mg ) first.  Avoid if you are having fevers or severe pain.  ILEOSTOMY PATIENTS WILL HAVE CHRONIC DIARRHEA since their colon is not in use.    Drink plenty of liquids.  You will need to drink even more glasses of water/liquid a day to avoid getting  dehydrated. Record output from your ileostomy.  Expect to empty the bag every 3-4 hours at first.  Most people with a permanent ileostomy empty their bag 4-6 times at the least.   Use antidiarrheal medicine (especially Imodium) several times a day to avoid getting dehydrated.  Start with  a dose at bedtime & breakfast.  Adjust up or down as needed.  Increase antidiarrheal medications as directed to avoid emptying the bag more than 8 times a day (every 3 hours). Work with your wound ostomy nurse to learn care for your ostomy.  See ostomy care instructions. TROUBLESHOOTING IRREGULAR BOWELS 1) Start with a soft & bland diet. No spicy, greasy, or fried foods.  2) Avoid gluten/wheat or dairy products from diet to see if symptoms improve. 3) Miralax 17gm or flax seed mixed in 8oz. water or juice-daily. May use 2-4 times a day as needed. 4) Gas-X, Phazyme, etc. as needed for gas & bloating.  5) Prilosec (omeprazole) over-the-counter as needed 6)  Consider probiotics (Align, Activa, etc) to help calm the bowels down  Call your doctor if you are getting worse or not getting better.  Sometimes further testing (cultures, endoscopy, X-ray studies, CT scans, bloodwork, etc.) may be needed to help diagnose and treat the cause of the diarrhea. [facility] [address], [address], [place], [place]  [address] [phone] - Main.    [phone]  - Toll Free.   [phone] - Fax [email or web address]     Appendicitis, Adult  Appendicitis is inflammation of the appendix. The appendix is a finger-shaped tube that is attached to the large intestine. If appendicitis is not treated, it can cause the appendix to tear (rupture). A ruptured appendix can lead to a life-threatening infection. It can also cause a painful collection of pus (abscess) to form in the appendix. What are the causes? This condition may be caused by a blockage in the appendix that leads to infection. The  blockage can be caused by:  A ball of stool (feces).  Enlarged lymph glands. In some cases, the cause may not be known. What increases the risk? Age is a risk factor. You are more likely to develop this condition if you are between 73 and 108 years of age. What are the signs or symptoms? Symptoms of this condition include:  Pain that starts around the belly button and moves toward the lower right part of the abdomen. The pain can become more severe as time passes. It gets worse with coughing or sudden movements.  Tenderness in the lower right abdomen.  Nausea.  Vomiting.  Loss of appetite.  Fever.  Difficulty passing stool (constipation).  Passing very loose stools (diarrhea).  Generally feeling unwell. How is this diagnosed? This condition may be diagnosed with:  A physical exam.  Blood tests.  Urine test. To confirm the diagnosis, an ultrasound, MRI, or CT scan may be done. How is this treated? Sometimes, this condition can be treated with antibiotic medicines alone. However, it is usually treated with both antibiotic medicines and surgery to remove the appendix (appendectomy). If only antibiotic medicine is given and the appendix is not removed, there is a chance that appendicitis could come back. There are two methods for doing an appendectomy:  Open appendectomy. In this surgery, the appendix is removed through a large incision that is made in the lower right abdomen. This procedure may be recommended if: ? You have major scarring from a previous surgery. ? You have a bleeding disorder. ? You are pregnant and are about to give birth. ? You have a condition that makes it hard to do surgery through small incisions (laparoscopic procedure). This includes severe infection or a ruptured appendix.  Laparoscopic appendectomy. In this surgery, the appendix is removed through small incisions. This procedure  usually causes less pain and fewer problems than an open  appendectomy. It also has a shorter recovery time. If the appendix has ruptured and an abscess has formed:  A drain may be placed into the abscess to remove fluid.  Antibiotic medicines may be given through an IV.  The appendix may or may not need to be removed. Follow these instructions at home: If you had surgery, follow instructions from your health care provider about how to care for yourself at home and how to care for your incision. Medicines  Take over-the-counter and prescription medicines only as told by your health care provider.  If you were prescribed an antibiotic medicine, take it as told by your health care provider. Do not stop taking the antibiotic even if you start to feel better. Eating and drinking  Follow instructions from your health care provider about eating restrictions. You may slowly resume a regular diet once your nausea or vomiting stops. General instructions  Do not use any products that contain nicotine or tobacco, such as cigarettes, e-cigarettes, and chewing tobacco. If you need help quitting, ask your health care provider.  Do not drive or use heavy machinery while taking prescription pain medicine.  Ask your health care provider if the medicine prescribed to you can cause constipation. You may need to take steps to prevent or treat constipation, such as: ? Drink enough fluid to keep your urine pale yellow. ? Take over-the-counter or prescription medicines. ? Eat foods that are high in fiber, such as beans, whole grains, and fresh fruits and vegetables. ? Limit foods that are high in fat and processed sugars, such as fried or sweet foods.  Keep all follow-up visits as told by your health care provider. This is important. Contact a health care provider if:  There is pus, blood, or excessive drainage coming from your incision.  You have nausea or vomiting. Get help right away if you have:  Worsening abdominal pain.  A  fever.  Chills.  Fatigue.  Muscle aches.  Shortness of breath. Summary  Appendicitis is inflammation of the appendix.  This condition may be caused by a blockage in the appendix that leads to infection.  This condition is usually treated with both antibiotic medicines and surgery to remove the appendix (appendectomy). This information is not intended to replace advice given to you by your health care provider. Make sure you discuss any questions you have with your health care provider. Document Revised: 08/06/2019 Document Reviewed: 10/26/2017 Elsevier Patient Education  2021 [employer].

## 2020-08-30 NOTE — ED Triage Notes (Signed)
Pt presents with c/o lower abdominal pain as well as some right side flank pain for approx one week. Pt denies any hematuria or hx of kidney stones.

## 2020-08-30 NOTE — Anesthesia Preprocedure Evaluation (Signed)
Anesthesia Evaluation  Patient identified by MRN, date of birth, ID band Patient awake    Reviewed: Allergy & Precautions, NPO status , Patient's Chart, lab work & pertinent test results  History of Anesthesia Complications Negative for: history of anesthetic complications  Airway Mallampati: I  TM Distance: >3 FB Neck ROM: Full    Dental  (+) Dental Advisory Given, Teeth Intact   Pulmonary neg shortness of breath, neg sleep apnea, neg COPD, neg recent URI, Current Smoker and Patient abstained from smoking.,  Covid-19 Nucleic Acid Test Results Lab Results      Component                Value               Date                      SARSCOV2NAA              NEGATIVE            08/30/2020              breath sounds clear to auscultation       Cardiovascular negative cardio ROS   Rhythm:Regular     Neuro/Psych PSYCHIATRIC DISORDERS Anxiety negative neurological ROS     GI/Hepatic Neg liver ROS, ACUTE APPENDICITIS   Endo/Other  negative endocrine ROS  Renal/GU negative Renal ROS     Musculoskeletal negative musculoskeletal ROS (+)   Abdominal   Peds  Hematology negative hematology ROS (+)   Anesthesia Other Findings   Reproductive/Obstetrics Lab Results      Component                Value               Date                      HCG                      <5.0                08/30/2020                                        Anesthesia Physical Anesthesia Plan  ASA: II  Anesthesia Plan: General   Post-op Pain Management:    Induction: Intravenous  PONV Risk Score and Plan: 2 and Ondansetron and Dexamethasone  Airway Management Planned: Oral ETT  Additional Equipment: None  Intra-op Plan:   Post-operative Plan: Extubation in OR  Informed Consent: I have reviewed the patients History and Physical, chart, labs and discussed the procedure including the risks, benefits and  alternatives for the proposed anesthesia with the patient or authorized representative who has indicated his/her understanding and acceptance.     Dental advisory given  Plan Discussed with: CRNA and Surgeon  Anesthesia Plan Comments:         Anesthesia Quick Evaluation

## 2020-08-30 NOTE — Anesthesia Procedure Notes (Signed)
Date/Time: 08/30/2020 8:59 PM Performed by: Minerva Ends, CRNA Oxygen Delivery Method: Simple face mask Placement Confirmation: positive ETCO2 and breath sounds checked- equal and bilateral Dental Injury: Teeth and Oropharynx as per pre-operative assessment

## 2020-08-30 NOTE — Op Note (Signed)
PATIENT:  Colleen Turner  20 y.o. female  Patient Care Team: Pcp, No as PCP - General  PRE-OPERATIVE DIAGNOSIS:  ACUTE APPENDICITIS  POST-OPERATIVE DIAGNOSIS:  ACUTE PHLEGMONOUS APPENDICTIS  PROCEDURE:  APPENDECTOMY LAPAROSCOPIC TRANSVERSUS ABDOMINIS PLANE (TAP) BLOCK - BILATERAL  SURGEON:  Ardeth Sportsman, MD  ANESTHESIA:   local and general  Regional TRANSVERSUS ABDOMINIS PLANE (TAP) nerve block for perioperative pain control provided with liposomal bupivacaine (Experel) mixed with 0.25% bupivacaine as a Bilateral TAP block x 9mL each side at the level of the transverse abdominis & preperitoneal spaces along the flank at the anterior axillary line, from subcostal ridge to iliac crest under laparoscopic guidance   EBL:  Total I/O In: 1300 [I.V.:1300] Out: 185 [Urine:175; Blood:10]  Delay start of Pharmacological VTE agent (>24hrs) due to surgical blood loss or risk of bleeding:  no  DRAINS: none   SPECIMEN:   APPENDIX  DISPOSITION OF SPECIMEN:  PATHOLOGY  COUNTS:  YES  PLAN OF CARE: Admit for overnight observation  PATIENT DISPOSITION:  PACU - hemodynamically stable.   INDICATIONS: Patient with concerning symptoms & work up suspicious for appendicitis.  Surgery was recommended:  The anatomy & physiology of the digestive tract was discussed.  The pathophysiology of appendicitis was discussed.  Natural history risks without surgery was discussed.   I feel the risks of no intervention will lead to serious problems that outweigh the operative risks; therefore, I recommended diagnostic laparoscopy with removal of appendix to remove the pathology.  Laparoscopic & open techniques were discussed.   I noted a good likelihood this will help address the problem.    Risks such as bleeding, infection, abscess, leak, reoperation, possible ostomy, hernia, heart attack, death, and other risks were discussed.  Goals of post-operative recovery were discussed as well.  We will work to  minimize complications.  Questions were answered.  The patient expresses understanding & wishes to proceed with surgery.  OR FINDINGS: Inflamed appendix with phlegmon.  No strong evidence of definite perforation or gangrene.  CASE DATA:  Type of patient?: LDOW CASE (Surgical Hospitalist [facility] Inpatient)  Status of Case? EMERGENT Add On  Infection Present At Time Of Surgery (PATOS)?  PHLEGMON  DESCRIPTION:   The patient was identified & brought into the operating room. The patient was positioned supine with arms tucked. SCDs were active during the entire case. The patient underwent general anesthesia without any difficulty.  The abdomen was prepped and draped in a sterile fashion. A Surgical Timeout confirmed our plan.   I made a transverse incision through the superior umbilical fold.  I made a small transverse nick through the infraumbilical fascia and confirmed peritoneal entry.  I placed a 23mm port.  We induced carbon dioxide insufflation.  Camera inspection revealed no injury.  I placed additional ports under direct laparoscopic visualization.  I mobilized the terminal ileum to proximal ascending colon in a lateral to medial fashion.  I took care to avoid injuring any retroperitoneal structures.  Upon entering the abdomen (organ space), I encountered a phlegmon involving the appendix.  I freed the appendix off its attachments to the ascending colon and cecal mesentery.  I elevated the appendix. I skeletonized the mesoappendix.  I ligated the mesoappendix and assured hemostasis in the mesentery. I was able to free off the base of the appendix which was still viable.  I stapled the appendix off the cecum using a laparoscopic stapler. I took a healthy cuff of viable cecum. I placed the appendix inside an  EcoSac bag and removed out the 66mm stapler port.  I did copious irrigation. Hemostasis was good in the mesoappendix, colon mesentery, and retroperitoneum. Staple line was intact on the cecum with  no bleeding. I washed out the pelvis, retrohepatic space and right paracolic gutter. I washed out the left side as well.  Hemostasis is good. There was no perforation or injury. Because the area cleaned up well after irrigation, I did not place a drain.  I closed the 12 mm stapler port site with a #1 PDS stitch interrupted.   I aspirated the carbon dioxide. I removed the ports.  I closed skin using 4-0 monocryl stitch.  Sterile dressings applied.  Patient was extubated and sent to the recovery room.  I discussed the operative findings with the patient's mother, Colleen Turner. I suspect the patient is going used in the hospital at least overnight and will need antibiotics overnight. Questions answered. She expressed understanding and appreciation.  Ardeth Sportsman, M.D., F.A.C.S. Gastrointestinal and Minimally Invasive Surgery [facility] [address] [place], [place] [address] [phone] Main / Paging  08/30/2020 9:22 PM

## 2020-08-30 NOTE — ED Provider Notes (Signed)
[facility]-EMERGENCY DEPT Provider Note   CSN: [id number] Arrival date & time: 08/30/20  1008     History Chief Complaint  Patient presents with  . Abdominal Pain    Colleen Turner is a 20 y.o. female presents to the ED for evaluation of abdominal pain for the last week.  Reports the pain began right after she finished her.  So she thought it was just.  Cramps.  Over the last few days the pain is worsened.  Woke up this morning and the pain was severe and she could not move.  The pain is located in the lower abdomen but worse in the right lower quadrant by her hip bone.  It is worse with laying flat, sitting, moving, palpation.  Radiates to her low back diffusely.  Associated with nausea.  She is sexually active with a female partner with inconsistent condom use.  She had unprotected sexual intercourse in March and states that she took Plan B emergency contraception the first week of March.  Since she has had 2 periods in March.  She is supposed to get her period again in about a week.  She was given Tylenol in triage but reports minimal improvement.  Denies fevers, chills.  No vomiting.  No diarrhea.  Reports decreased bowel movements.  Reports that she usually has several BMs in 1 day but she is having less than this.  No dysuria, frequency but has urinary urgency.  No abdominal surgeries.  No history of kidney stones. No alleviating factors. No history of ovarian cysts, fibroids.    HPI     History reviewed. No pertinent past medical history.  There are no problems to display for this patient.   History reviewed. No pertinent surgical history.   OB History   No obstetric history on file.     History reviewed. No pertinent family history.     Home Medications Prior to Admission medications   Medication Sig Start Date End Date Taking? Authorizing Provider  APTENSIO XR 10 MG CP24 Take 10 mg by mouth daily. 02/03/17   [provider]  ondansetron  (ZOFRAN ODT) 8 MG disintegrating tablet 8mg  ODT q4 hours prn nausea 03/24/17   Muthersbaugh, 13/1/18, PA-C    Allergies    Penicillins, Azithromycin, and Pepto-bismol [bismuth subsalicylate]  Review of Systems   Review of Systems  Gastrointestinal: Positive for abdominal pain and nausea.  Musculoskeletal: Positive for back pain.  All other systems reviewed and are negative.   Physical Exam Updated Vital Signs BP 115/73   Pulse 86   Temp 98.2 F (36.8 C) (Oral)   Resp 16   Ht 5\' 9"  (1.753 m)   Wt 70.3 kg   LMP 08/13/2020 (Approximate)   SpO2 100%   BMI 22.89 kg/m   Physical Exam Vitals and nursing note reviewed.  Constitutional:      General: She is not in acute distress.    Appearance: She is well-developed.     Comments: NAD.  HENT:     Head: Normocephalic and atraumatic.     Right Ear: External ear normal.     Left Ear: External ear normal.     Nose: Nose normal.  Eyes:     General: No scleral icterus.    Conjunctiva/sclera: Conjunctivae normal.  Cardiovascular:     Rate and Rhythm: Normal rate and regular rhythm.     Heart sounds: Normal heart sounds.  Pulmonary:     Effort: Pulmonary effort is normal.  Breath sounds: Normal breath sounds.  Abdominal:     Tenderness: There is abdominal tenderness in the right lower quadrant, suprapubic area and left lower quadrant. There is right CVA tenderness and guarding. Positive signs include Rovsing's sign and McBurney's sign.  Musculoskeletal:        General: No deformity. Normal range of motion.     Cervical back: Normal range of motion and neck supple.  Skin:    General: Skin is warm and dry.     Capillary Refill: Capillary refill takes less than 2 seconds.  Neurological:     Mental Status: She is alert and oriented to person, place, and time.  Psychiatric:        Behavior: Behavior normal.        Thought Content: Thought content normal.        Judgment: Judgment normal.     ED Results / Procedures /  Treatments   Labs (all labs ordered are listed, but only abnormal results are displayed) Labs Reviewed  CBC WITH DIFFERENTIAL/PLATELET - Abnormal; Notable for the following components:      Result Value   RBC 5.37 (*)    MCV 72.6 (*)    MCH 23.1 (*)    All other components within normal limits  URINALYSIS, ROUTINE W REFLEX MICROSCOPIC - Abnormal; Notable for the following components:   Specific Gravity, Urine 1.033 (*)    Ketones, ur 80 (*)    All other components within normal limits  COMPREHENSIVE METABOLIC PANEL  I-STAT BETA HCG BLOOD, ED ([facility], [facility], [facility] ONLY)    EKG None  Radiology No results found.  Procedures Procedures   Medications Ordered in ED Medications  iohexol (OMNIPAQUE) 300 MG/ML solution 100 mL (has no administration in time range)  acetaminophen (TYLENOL) tablet 1,000 mg (1,000 mg Oral Given 08/30/20 1114)  lactated ringers bolus 1,000 mL (0 mLs Intravenous Stopped 08/30/20 1400)  morphine 4 MG/ML injection 4 mg (4 mg Intravenous Given 08/30/20 1324)  fentaNYL (SUBLIMAZE) injection 50 mcg (50 mcg Intravenous Given 08/30/20 1523)  ondansetron (ZOFRAN) injection 4 mg (4 mg Intravenous Given 08/30/20 1519)    ED Course  I have reviewed the triage vital signs and the nursing notes.  Pertinent labs & imaging results that were available during my care of the patient were reviewed by me and considered in my medical decision making (see chart for details).  Clinical Course as of 08/30/20 1526  Sat Aug 30, 2020  1442 Spoke with patient's mother Dr. Jameca Chumley who is in [place].  Updated her on plan of care and pending CT scan. [CG]    Clinical Course User Index [CG] Liberty Handy, PA-C   MDM Rules/Calculators/A&P                          20 year old female presents to the ED for right lower quadrant abdominal pain with nausea for 1 week.  Acutely worsened this morning.  Looks uncomfortable on exam, positive McBurney's, guarding, Rovsing's.  DDx includes  appendicitis, ureteral stone, pyelonephritis, right-sided diverticulitis less likely.  Has no vaginal complaints like changes in discharge, bleeding and her tenderness is right above hip prominence.  Offered pelvic exam for thoroughness however patient felt comfortable deferring this as she is not having any symptoms.  I think this is reasonable.  Labs, imaging ordered by me as above.  These were personally visualized and interpreted  Labs reveal-normal WBC.  hCG is negative.  Urinalysis with  80 ketones only, no RBCs or obvious infection.  Patient reevaluated after medicines.  Repeat abdominal exam reveals persistent right lower quadrant tenderness.  We will proceed with CT scan.  Meds ordered in the ED-Zofran, morphine, Tylenol, LR bolus, fentanyl.  1515: Spoke to patient's mother Dr Margaretmary Bayley who is OBGYN.  Discussed ER POC and plan for CT. Patient and mother in agreement.   Patient care transferred to oncoming EDPA who will follow up on CT, reassess and determine disposition.  Final Clinical Impression(s) / ED Diagnoses Final diagnoses:  RLQ abdominal pain    Rx / DC Orders ED Discharge Orders    None       Liberty Handy, PA-C 08/30/20 1526    Benjiman Core, MD 08/31/20 [phone]

## 2020-08-30 NOTE — Transfer of Care (Signed)
Immediate Anesthesia Transfer of Care Note  Patient: Colleen Turner  Procedure(s) Performed: APPENDECTOMY LAPAROSCOPIC (N/A )  Patient Location: PACU  Anesthesia Type:General  Level of Consciousness: awake and alert   Airway & Oxygen Therapy: Patient Spontanous Breathing and Patient connected to face mask oxygen  Post-op Assessment: Report given to RN and Post -op Vital signs reviewed and stable  Post vital signs: Reviewed and stable  Last Vitals:  Vitals Value Taken Time  BP 114/73 08/30/20 2145  Temp 36.7 C 08/30/20 2130  Pulse 64 08/30/20 2155  Resp 11 08/30/20 2155  SpO2 100 % 08/30/20 2155  Vitals shown include unvalidated device data.  Last Pain:  Vitals:   08/30/20 2106  TempSrc:   PainSc: 0-No pain         Complications: No complications documented.

## 2020-08-30 NOTE — Interval H&P Note (Signed)
History and Physical Interval Note:  08/30/2020 6:22 PM  Colleen Turner  has presented today for surgery, with the diagnosis of ACUTE APPENDICITIS.  The various methods of treatment have been discussed with the patient and family. After consideration of risks, benefits and other options for treatment, the patient has consented to  Procedure(s): APPENDECTOMY LAPAROSCOPIC (N/A) as a surgical intervention.  The patient's history has been reviewed, patient examined, no change in status, stable for surgery.  I have reviewed the patient's chart and labs.  Questions were answered to the patient's satisfaction.    I have re-reviewed the the patient's records, history, medications, and allergies.  I have re-examined the patient.  I again discussed intraoperative plans and goals of post-operative recovery.  The patient agrees to proceed.  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 No pertinent past medical history.  History reviewed. No pertinent surgical history.  Social History   Socioeconomic History   Marital status: Single    Spouse name: Not on file   Number of children: Not on file   Years of education: Not on file   Highest education level: Not on file  Occupational History   Not on file  Tobacco Use   Smoking status: Current Every Day Smoker    Packs/day: 1.00    Years: 5.00    Pack years: 5.00    Types: Cigarettes   Smokeless tobacco: Not on file  Substance and Sexual Activity   Alcohol use: Yes   Drug use: Not on file   Sexual activity: Not on file  Other Topics Concern   Not on file  Social History Narrative   Not on file   Social Determinants of Health   Financial Resource Strain: Not on file  Food Insecurity: Not  on file  Transportation Needs: Not on file  Physical Activity: Not on file  Stress: Not on file  Social Connections: Not on file  Intimate Partner Violence: Not on file    History reviewed. No pertinent family history.  (Not in a hospital admission)   Current Facility-Administered Medications  Medication Dose Route Frequency Provider Last Rate Last Admin   acetaminophen (TYLENOL) tablet 1,000 mg  1,000 mg Oral On Call to OR Chilton Si, Terri L, RPH       bupivacaine liposome (EXPAREL) 1.3 % injection 266 mg  20 mL Infiltration Once Karie Soda, MD       celecoxib (CELEBREX) capsule 200 mg  200 mg Oral On Call to OR Green, Terri L, RPH       Chlorhexidine Gluconate Cloth 2 % PADS 6 each  6 each Topical Once Karie Soda, MD       And   Chlorhexidine Gluconate Cloth 2 % PADS 6 each  6 each Topical Once Karie Soda, MD       Melene Muller ON 08/31/2020] clindamycin (CLEOCIN) IVPB 900 mg  900 mg Intravenous On Call to OR Karie Soda, MD       And   gentamicin (GARAMYCIN) 350 mg in dextrose 5 % 100 mL IVPB  5 mg/kg Intravenous On Call to OR Karie Soda, MD       Melene Muller ON 08/31/2020] gabapentin (NEURONTIN) capsule 300 mg  300 mg Oral On Call to  OR Karie Soda, MD       metroNIDAZOLE (FLAGYL) IVPB 500 mg  500 mg Intravenous Once Sponseller, Rebekah R, PA-C       Current Outpatient Medications  Medication Sig Dispense Refill   APTENSIO XR 10 MG CP24 Take 10 mg by mouth daily.  0   ondansetron (ZOFRAN ODT) 8 MG disintegrating tablet 8mg  ODT q4 hours prn nausea 4 tablet 0     Allergies  Allergen Reactions   Penicillins     Unknown reaction childhood allergy    Azithromycin Rash   Pepto-Bismol [Bismuth Subsalicylate] Rash    BP 116/79 (BP Location: Left Arm)   Pulse 77   Temp 98.2 F (36.8 C) (Oral)   Resp 18   Ht 5\' 9"  (1.753 m)   Wt 70.3 kg   LMP 08/13/2020 (Approximate) Comment: negative upreg.  SpO2 100%   BMI 22.89 kg/m   Labs: Results for orders placed or performed  during the hospital encounter of 08/30/20 (from the past 48 hour(s))  Comprehensive metabolic panel     Status: None   Collection Time: 08/30/20 10:35 AM  Result Value Ref Range   Sodium 139 135 - 145 mmol/L   Potassium 4.3 3.5 - 5.1 mmol/L   Chloride 107 98 - 111 mmol/L   CO2 24 22 - 32 mmol/L   Glucose, Bld 83 70 - 99 mg/dL    Comment: Glucose reference range applies only to samples taken after fasting for at least 8 hours.   BUN 14 6 - 20 mg/dL   Creatinine, Ser 10/30/20 0.44 - 1.00 mg/dL   Calcium 9.3 8.9 - 10/30/20 mg/dL   Total Protein 7.4 6.5 - 8.1 g/dL   Albumin 4.0 3.5 - 5.0 g/dL   AST 22 15 - 41 U/L   ALT 13 0 - 44 U/L   Alkaline Phosphatase 42 38 - 126 U/L   Total Bilirubin 1.0 0.3 - 1.2 mg/dL   GFR, Estimated 7.82 95.6 mL/min    Comment: (NOTE) Calculated using the CKD-EPI Creatinine Equation (2021)    Anion gap 8 5 - 15    Comment: Performed at [facility], [address] [place], [place]  CBC with Differential     Status: Abnormal   Collection Time: 08/30/20 10:35 AM  Result Value Ref Range   WBC 7.0 4.0 - 10.5 K/uL   RBC 5.37 (H) 3.87 - 5.11 MIL/uL   Hemoglobin 12.4 12.0 - 15.0 g/dL   HCT 86578 10/30/20 - 46.9 %   MCV 72.6 (L) 80.0 - 100.0 fL   MCH 23.1 (L) 26.0 - 34.0 pg   MCHC 31.8 30.0 - 36.0 g/dL   RDW 62.9 52.8 - 41.3 %   Platelets 230 150 - 400 K/uL   nRBC 0.0 0.0 - 0.2 %   Neutrophils Relative % 66 %   Neutro Abs 4.6 1.7 - 7.7 K/uL   Lymphocytes Relative 24 %   Lymphs Abs 1.7 0.7 - 4.0 K/uL   Monocytes Relative 6 %   Monocytes Absolute 0.5 0.1 - 1.0 K/uL   Eosinophils Relative 3 %   Eosinophils Absolute 0.2 0.0 - 0.5 K/uL   Basophils Relative 1 %   Basophils Absolute 0.1 0.0 - 0.1 K/uL   Immature Granulocytes 0 %   Abs Immature Granulocytes 0.02 0.00 - 0.07 K/uL    Comment: Performed at [facility], [address] [place], [place]  Urinalysis, Routine w reflex microscopic  Status: Abnormal    Collection Time: 08/30/20 10:35 AM  Result Value Ref Range   Color, Urine YELLOW YELLOW   APPearance CLEAR CLEAR   Specific Gravity, Urine 1.033 (H) 1.005 - 1.030   pH 5.0 5.0 - 8.0   Glucose, UA NEGATIVE NEGATIVE mg/dL   Hgb urine dipstick NEGATIVE NEGATIVE   Bilirubin Urine NEGATIVE NEGATIVE   Ketones, ur 80 (A) NEGATIVE mg/dL   Protein, ur NEGATIVE NEGATIVE mg/dL   Nitrite NEGATIVE NEGATIVE   Leukocytes,Ua NEGATIVE NEGATIVE    Comment: Performed at [facility], [address], [place], [place] [address]  I-Stat beta hCG blood, ED     Status: None   Collection Time: 08/30/20  2:23 PM  Result Value Ref Range   I-stat hCG, quantitative <5.0 <5 mIU/mL   Comment 3            Comment:   GEST. AGE      CONC.  (mIU/mL)   <=1 WEEK        5 - 50     2 WEEKS       50 - 500     3 WEEKS       100 - 10,000     4 WEEKS     1,000 - 30,000        FEMALE AND NON-PREGNANT FEMALE:     LESS THAN 5 mIU/mL     Imaging / Studies: CT ABDOMEN PELVIS W CONTRAST  Result Date: 08/30/2020 CLINICAL DATA:  Right lower quadrant abdominal pain for 1 week, worsening today, appendicitis suspected EXAM: CT ABDOMEN AND PELVIS WITH CONTRAST TECHNIQUE: Multidetector CT imaging of the abdomen and pelvis was performed using the standard protocol following bolus administration of intravenous contrast. CONTRAST:  OMNIPAQUE IOHEXOL 300 MG/ML  SOLN COMPARISON:  None. FINDINGS: Lower chest: No acute abnormality. Hepatobiliary: No solid liver abnormality is seen. No gallstones, gallbladder wall thickening, or biliary dilatation. Pancreas: Unremarkable. No pancreatic ductal dilatation or surrounding inflammatory changes. Spleen: Normal in size without significant abnormality. Adrenals/Urinary Tract: Adrenal glands are unremarkable. Kidneys are normal, without renal calculi, solid lesion, or hydronephrosis. Bladder is unremarkable. Stomach/Bowel: Stomach is within normal limits. The appendix is diffusely  thickened and hyperenhancing, measuring approximately 0.9 cm (series 4, image 60, series 5, image 35). Adjacent fat stranding. No evidence of bowel wall thickening or distention. Vascular/Lymphatic: No significant vascular findings are present. No enlarged abdominal or pelvic lymph nodes. Reproductive: No mass or other significant abnormality. Cysts and follicles of the ovaries. Other: No abdominal wall hernia or abnormality. Small volume fluid in the right lower quadrant and pelvis. Musculoskeletal: No acute or significant osseous findings. IMPRESSION: 1. The appendix is diffusely thickened and hyperenhancing, measuring approximately 0.9 cm. Adjacent fat stranding. Findings are consistent with acute appendicitis. No evidence of perforation or abscess. 2.  Small volume fluid in the right lower quadrant and pelvis. Electronically Signed   By: Lauralyn Primes M.D.   On: 08/30/2020 16:10     .Ardeth Sportsman, M.D., F.A.C.S. Gastrointestinal and Minimally Invasive Surgery [facility] [address] [place], [place] [address] [phone] Main / Paging  08/30/2020 6:22 PM    Ardeth Sportsman

## 2020-08-30 NOTE — Progress Notes (Signed)
Patient extubated in recovery room looking more stable.  Security updated me that family has arrived from [place].  I met them in person in the lobby.  I discussed operative findings, updated the patient's status, discussed probable steps to recovery, and gave postoperative recommendations to the patient's parents.  Recommendations were made.  Questions were answered.  They expressed understanding & appreciation.  Adin Hector, MD, FACS, MASCRS  Esophageal, Gastrointestinal & Colorectal Surgery Robotic and Minimally Invasive Surgery [facility] [address], [place], [place] [address] [phone] Fax [phone] Main/Paging  CONTACT INFORMATION: Weekday (9AM-5PM) concerns: Call [facility] main office at [phone] Weeknight (5PM-9AM) or Weekend/Holiday concerns: Check [email or web address] for General Surgery [facility] coverage (Please, do not use SecureChat as it is not reliable communication to operating surgeons for immediate patient care)

## 2020-08-30 NOTE — ED Notes (Signed)
Unsuccessful attempt at blood draw. Will obtain blood once pt is back in the room.

## 2020-08-30 NOTE — ED Triage Notes (Signed)
Emergency Medicine Provider Triage Evaluation Note  Colleen Turner , a 20 y.o. female  was evaluated in triage.  Pt complains of abdominal pain.  Symptoms started in her right lower quadrant about 1 week ago.  They have been progressively worsening.  Her current pain is 8/10.  Also reports right low back pain.  No history of kidney stones.  No urinary complaints.  Reports intermittent nausea but no vomiting or diarrhea.  Last menstrual period was on March 23 and was normal.  She is sexually active with a female partner and states that she uses condoms.  Physical Exam  BP 110/84 (BP Location: Left Arm)   Pulse (!) 107   Temp 98.2 F (36.8 C) (Oral)   Resp 16   Ht 5\' 9"  (1.753 m)   Wt 70.3 kg   LMP 08/13/2020 (Approximate)   SpO2 100%   BMI 22.89 kg/m  Gen:   Awake, appears to be in pain HEENT:  Atraumatic  Resp:  Normal effort  Cardiac:  Normal rate  Abd:   Nondistended, moderate right lower quadrant tenderness, positive Rovsing sign MSK:   Moves extremities without difficulty, moderate tenderness in the right low back Neuro:  Speech clear   Medical Decision Making  Medically screening exam initiated at 10:35 AM.  Appropriate orders placed.  Hawley Doetsch was informed that the remainder of the evaluation will be completed by another provider, this initial triage assessment does not replace that evaluation, and the importance of remaining in the ED until their evaluation is complete.    Esmond Camper, PA-C 08/30/20 1037

## 2020-08-30 NOTE — Anesthesia Procedure Notes (Signed)
Procedure Name: Intubation Date/Time: 08/30/2020 8:09 PM Performed by: Minerva Ends, CRNA Pre-anesthesia Checklist: Patient identified, Emergency Drugs available, Suction available and Patient being monitored Patient Re-evaluated:Patient Re-evaluated prior to induction Oxygen Delivery Method: Circle System Utilized Preoxygenation: Pre-oxygenation with 100% oxygen Induction Type: IV induction, Rapid sequence and Cricoid Pressure applied Ventilation: Mask ventilation without difficulty Laryngoscope Size: Miller and 2 Grade View: Grade I Tube type: Oral Tube size: 7.0 mm Number of attempts: 1 Airway Equipment and Method: Stylet Placement Confirmation: ETT inserted through vocal cords under direct vision,  positive ETCO2 and breath sounds checked- equal and bilateral Secured at: 22 cm Tube secured with: Tape Dental Injury: Teeth and Oropharynx as per pre-operative assessment  Comments: Smooth IV induction Moser- intubation AM CRNA atraumatic- teeth and mouth as preop - bilat BS

## 2020-08-30 NOTE — H&P (Addendum)
Colleen Turner  November 17, 2000 [id number]  CARE TEAM:  PCP: Pcp, No  Outpatient Care Team: Patient Care Team: Pcp, No as PCP - General  Inpatient Treatment Team: Treatment Team: Attending Provider: Benjiman CorePickering, Nathan, MD; Registered Nurse: Shellia CarwinFrickey, Jonathan B, RN; Technician: Jamelle Rushingavis, Kendra N; Registered Nurse: Erenest RasherBanno, Anna G, RN; Physician Assistant: Sherrilee GillesSponseller, Rebekah R, PA-C; Consulting Physician: Montez Moritacs, Md, MD   This patient is a 20 y.o.female who presents today for surgical evaluation at the request of Tomasa Randebekah Sponsseller, PA.   Chief complaint / Reason for evaluation: Appendicitis  Young woman who has had 1 week history of crampy lower abdominal pain.  Just coming over her menstrual period.  Used Plan B a few weeks ago as well.  No major spotting.  However she woke up this morning with the pain far more intense, especially right lower abdomen .  Feeling nauseated.  Some retching.  Concerned her.  Came to emergency room.  Urine pregnancy negative.  No major leukocytosis or fever but physical exam concerning.  Urinalysis underwhelming for UTI.  Work-up and CAT scan concerning for appendicitis.  Surgical consultation requested.  Patient is vaccinated to Covid.  Current respiratory Covid panel pending.  Patient does occasionally smoke.  Some intermittent alcohol use but no other drug use.  No prior abdominal surgeries.  Rather physically active.  No major bouts of constipation or diarrhea.  No personal nor family history of GI/colon cancer, inflammatory bowel disease, irritable bowel syndrome, allergy such as Celiac Sprue, dietary/dairy problems, colitis, ulcers nor gastritis.  No recent sick contacts/gastroenteritis.  No travel outside the country.  No changes in diet.  No dysphagia to solids or liquids.  No significant heartburn or reflux.  No hematochezia, hematemesis, coffee ground emesis.  No evidence of prior gastric/peptic ulceration.    Assessment  Colleen Turner  20 y.o. female        Problem List:  Principal Problem:   Acute appendicitis Active Problems:   History of acute alcohol intoxication   History physical exam suspicious for appendicitis.  CT scan with evidence of early appendicitis  Plan:  IV antibiotics  IV fluid resuscitation.  Nausea pain control.  Diagnostic laparoscopy with appendectomy.  CT scan does not show any obvious perforation or large phlegmon.  Perhaps she was getting over menstrual cramping for most a week in the appendicitis really started last night.  With 7-day history of symptoms, could have significant phlegmon perforation with postoperative ileus.  We will see.  The anatomy & physiology of the digestive tract was discussed.  The pathophysiology of appendicitis and other appendiceal disorders were discussed.  Natural history risks without surgery was discussed.   I feel the risks of no intervention will lead to serious problems that outweigh the operative risks; therefore, I recommended diagnostic laparoscopy with removal of appendix to remove the pathology.  Laparoscopic & open techniques were discussed.   I noted a good likelihood this will help address the problem.   Risks such as bleeding, infection, abscess, leak, reoperation, injury to other organs, need for repair of tissues / organs, possible ostomy, hernia, heart attack, stroke, death, and other risks were discussed.  Goals of post-operative recovery were discussed as well.  We will work to minimize complications.  Questions were answered.  The patient expresses understanding & wishes to proceed with surgery.  Patient's mother is aware and she is flying into town.  STOP SMOKING! We talked to the patient about the dangers of smoking.  We stressed that  tobacco use dramatically increases the risk of peri-operative complications such as infection, tissue necrosis leaving to problems with incision/wound and organ healing, hernia, chronic pain, heart attack, stroke, DVT, pulmonary  embolism, and death.  We noted there are programs in our community to help stop smoking.  Information was available.   -VTE prophylaxis- SCDs, etc -mobilize as tolerated to help recovery  30 minutes spent in review, evaluation, examination, counseling, and coordination of care.  More than 50% of that time was spent in counseling.  Ardeth Sportsman, MD, FACS, MASCRS  Esophageal, Gastrointestinal & Colorectal Surgery Robotic and Minimally Invasive Surgery [facility] [address] [place], [place] [address] [phone] Fax [phone] Main/Paging  CONTACT INFORMATION: Weekday (9AM-5PM) concerns: Call [facility] main office at [phone] Weeknight (5PM-9AM) or Weekend/Holiday concerns: Check [email or web address] for General Surgery [facility] coverage (Please, do not use SecureChat as it is not reliable communication to operating surgeons for immediate patient care)      08/30/2020      History reviewed. No pertinent past medical history.  History reviewed. No pertinent surgical history.  Social History   Socioeconomic History  . Marital status: Single    Spouse name: Not on file  . Number of children: Not on file  . Years of education: Not on file  . Highest education level: Not on file  Occupational History  . Not on file  Tobacco Use  . Smoking status: Not on file  . Smokeless tobacco: Not on file  Substance and Sexual Activity  . Alcohol use: Not on file  . Drug use: Not on file  . Sexual activity: Not on file  Other Topics Concern  . Not on file  Social History Narrative  . Not on file   Social Determinants of Health   Financial Resource Strain: Not on file  Food Insecurity: Not on file  Transportation Needs: Not on file  Physical Activity: Not on file  Stress: Not on file  Social Connections: Not on file  Intimate Partner Violence: Not on file    History reviewed. No pertinent family history.  Current Facility-Administered  Medications  Medication Dose Route Frequency Provider Last Rate Last Admin  . [START ON 08/31/2020] acetaminophen (TYLENOL) tablet 1,000 mg  1,000 mg Oral On Call to OR Karie Soda, MD      . bupivacaine liposome (EXPAREL) 1.3 % injection 266 mg  20 mL Infiltration Once Karie Soda, MD      . Melene Muller ON 08/31/2020] celecoxib (CELEBREX) capsule 200 mg  200 mg Oral On Call to OR Karie Soda, MD      . Chlorhexidine Gluconate Cloth 2 % PADS 6 each  6 each Topical Once Karie Soda, MD       And  . Chlorhexidine Gluconate Cloth 2 % PADS 6 each  6 each Topical Once Karie Soda, MD      . ciprofloxacin (CIPRO) IVPB 400 mg  400 mg Intravenous Once Sponseller, Rebekah R, PA-C       And  . metroNIDAZOLE (FLAGYL) IVPB 500 mg  500 mg Intravenous Once Sponseller, Rebekah R, PA-C      . [START ON 08/31/2020] clindamycin (CLEOCIN) IVPB 900 mg  900 mg Intravenous On Call to OR Karie Soda, MD       And  . Melene Muller ON 08/31/2020] gentamicin (GARAMYCIN) 350 mg in dextrose 5 % 100 mL IVPB  5 mg/kg Intravenous On Call to OR Karie Soda, MD      . [  START ON 08/31/2020] gabapentin (NEURONTIN) capsule 300 mg  300 mg Oral On Call to OR Karie Soda, MD      . morphine 4 MG/ML injection 4 mg  4 mg Intravenous Once Sponseller, Rebekah R, PA-C       Current Outpatient Medications  Medication Sig Dispense Refill  . APTENSIO XR 10 MG CP24 Take 10 mg by mouth daily.  0  . ondansetron (ZOFRAN ODT) 8 MG disintegrating tablet 8mg  ODT q4 hours prn nausea 4 tablet 0     Allergies  Allergen Reactions  . Penicillins     Unknown reaction childhood allergy   . Azithromycin Rash  . Pepto-Bismol [Bismuth Subsalicylate] Rash    ROS:   All other systems reviewed & are negative except per HPI or as noted below: Constitutional:  No fevers, chills, sweats.  Weight stable Eyes:  No vision changes, No discharge HENT:  No sore throats, nasal drainage Lymph: No neck swelling, No bruising easily Pulmonary:  No cough,  productive sputum CV: No orthopnea, PND  Patient walks 60 minutes for about 2 miles without difficulty.  No exertional chest/neck/shoulder/arm pain. GI:  No personal nor family history of GI/colon cancer, inflammatory bowel disease, irritable bowel syndrome, allergy such as Celiac Sprue, dietary/dairy problems, colitis, ulcers nor gastritis.  No recent sick contacts/gastroenteritis.  No travel outside the country.  No changes in diet. Renal: No UTIs, No hematuria Genital:  No drainage, bleeding, masses Musculoskeletal: No severe joint pain.  Good ROM major joints Skin:  No sores or lesions.  No rashes Heme/Lymph:  No easy bleeding.  No swollen lymph nodes Neuro: No focal weakness/numbness.  No seizures Psych: No suicidal ideation.  No hallucinations  BP 116/79 (BP Location: Left Arm)   Pulse 77   Temp 98.2 F (36.8 C) (Oral)   Resp 18   Ht 5\' 9"  (1.753 m)   Wt 70.3 kg   LMP 08/13/2020 (Approximate) Comment: negative upreg.  SpO2 100%   BMI 22.89 kg/m   Physical Exam: Constitutional: Thin but well-developed well-nourished.  Not cachectic.  Hygeine adequate.  Vitals signs as above.   Eyes: Pupils reactive, normal extraocular movements. Sclera nonicteric Neuro: CN II-XII intact.  No major focal sensory defects.  No major motor deficits. Lymph: No head/neck/groin lymphadenopathy Psych: Mildly anxious and agitated but consolable.  Clearly in moderate distress with pain nurse in room getting ready to give pain medication.   Judgment & insight Adequate, Oriented x4, HENT: Normocephalic, Mucus membranes moist.  No thrush.   Neck: Supple, No tracheal deviation.  No obvious thyromegaly Chest: No pain to chest wall compression.  Good respiratory excursion.  No audible wheezing CV:  Pulses intact.  Regular rhythm.  No major extremity edema Abdomen:  Somewhat firm.  Nondistended.  Tenderness at RLQ with involuntary guarding and rebound tenderness.  Focal peritonitis.  Left side of abdomen and  right upper quadrant soft and only mildly tender.  Positive Rosvig sign. No incarcerated hernias.  No hepatomegaly.  No splenomegaly Gen:  No inguinal hernias.  No inguinal lymphadenopathy.   Ext: No obvious deformity or contracture no significant edema.  No cyanosis Skin: No major subcutaneous nodules.  Warm and dry Musculoskeletal: Severe joint rigidity not present.  No obvious clubbing.  No digital petechiae.     Results:   Labs: Results for orders placed or performed during the hospital encounter of 08/30/20 (from the past 48 hour(s))  Comprehensive metabolic panel     Status: None   Collection Time:  08/30/20 10:35 AM  Result Value Ref Range   Sodium 139 135 - 145 mmol/L   Potassium 4.3 3.5 - 5.1 mmol/L   Chloride 107 98 - 111 mmol/L   CO2 24 22 - 32 mmol/L   Glucose, Bld 83 70 - 99 mg/dL    Comment: Glucose reference range applies only to samples taken after fasting for at least 8 hours.   BUN 14 6 - 20 mg/dL   Creatinine, Ser 1.47 0.44 - 1.00 mg/dL   Calcium 9.3 8.9 - 82.9 mg/dL   Total Protein 7.4 6.5 - 8.1 g/dL   Albumin 4.0 3.5 - 5.0 g/dL   AST 22 15 - 41 U/L   ALT 13 0 - 44 U/L   Alkaline Phosphatase 42 38 - 126 U/L   Total Bilirubin 1.0 0.3 - 1.2 mg/dL   GFR, Estimated >56 >21 mL/min    Comment: (NOTE) Calculated using the CKD-EPI Creatinine Equation (2021)    Anion gap 8 5 - 15    Comment: Performed at [facility], [address] [place], [place] [address]  CBC with Differential     Status: Abnormal   Collection Time: 08/30/20 10:35 AM  Result Value Ref Range   WBC 7.0 4.0 - 10.5 K/uL   RBC 5.37 (H) 3.87 - 5.11 MIL/uL   Hemoglobin 12.4 12.0 - 15.0 g/dL   HCT 78.4 69.6 - 29.5 %   MCV 72.6 (L) 80.0 - 100.0 fL   MCH 23.1 (L) 26.0 - 34.0 pg   MCHC 31.8 30.0 - 36.0 g/dL   RDW 28.4 13.2 - 44.0 %   Platelets 230 150 - 400 K/uL   nRBC 0.0 0.0 - 0.2 %   Neutrophils Relative % 66 %   Neutro Abs 4.6 1.7 - 7.7 K/uL   Lymphocytes Relative 24 %    Lymphs Abs 1.7 0.7 - 4.0 K/uL   Monocytes Relative 6 %   Monocytes Absolute 0.5 0.1 - 1.0 K/uL   Eosinophils Relative 3 %   Eosinophils Absolute 0.2 0.0 - 0.5 K/uL   Basophils Relative 1 %   Basophils Absolute 0.1 0.0 - 0.1 K/uL   Immature Granulocytes 0 %   Abs Immature Granulocytes 0.02 0.00 - 0.07 K/uL    Comment: Performed at [facility], [address] [place], [place] [address]  Urinalysis, Routine w reflex microscopic     Status: Abnormal   Collection Time: 08/30/20 10:35 AM  Result Value Ref Range   Color, Urine YELLOW YELLOW   APPearance CLEAR CLEAR   Specific Gravity, Urine 1.033 (H) 1.005 - 1.030   pH 5.0 5.0 - 8.0   Glucose, UA NEGATIVE NEGATIVE mg/dL   Hgb urine dipstick NEGATIVE NEGATIVE   Bilirubin Urine NEGATIVE NEGATIVE   Ketones, ur 80 (A) NEGATIVE mg/dL   Protein, ur NEGATIVE NEGATIVE mg/dL   Nitrite NEGATIVE NEGATIVE   Leukocytes,Ua NEGATIVE NEGATIVE    Comment: Performed at [facility], [address] [place], [place] [address]  I-Stat beta hCG blood, ED     Status: None   Collection Time: 08/30/20  2:23 PM  Result Value Ref Range   I-stat hCG, quantitative <5.0 <5 mIU/mL   Comment 3            Comment:   GEST. AGE      CONC.  (mIU/mL)   <=1 WEEK        5 - 50     2 WEEKS  50 - 500     3 WEEKS       100 - 10,000     4 WEEKS     1,000 - 30,000        FEMALE AND NON-PREGNANT FEMALE:     LESS THAN 5 mIU/mL     Imaging / Studies: CT ABDOMEN PELVIS W CONTRAST  Result Date: 08/30/2020 CLINICAL DATA:  Right lower quadrant abdominal pain for 1 week, worsening today, appendicitis suspected EXAM: CT ABDOMEN AND PELVIS WITH CONTRAST TECHNIQUE: Multidetector CT imaging of the abdomen and pelvis was performed using the standard protocol following bolus administration of intravenous contrast. CONTRAST:  OMNIPAQUE IOHEXOL 300 MG/ML  SOLN COMPARISON:  None. FINDINGS: Lower chest: No acute abnormality. Hepatobiliary: No  solid liver abnormality is seen. No gallstones, gallbladder wall thickening, or biliary dilatation. Pancreas: Unremarkable. No pancreatic ductal dilatation or surrounding inflammatory changes. Spleen: Normal in size without significant abnormality. Adrenals/Urinary Tract: Adrenal glands are unremarkable. Kidneys are normal, without renal calculi, solid lesion, or hydronephrosis. Bladder is unremarkable. Stomach/Bowel: Stomach is within normal limits. The appendix is diffusely thickened and hyperenhancing, measuring approximately 0.9 cm (series 4, image 60, series 5, image 35). Adjacent fat stranding. No evidence of bowel wall thickening or distention. Vascular/Lymphatic: No significant vascular findings are present. No enlarged abdominal or pelvic lymph nodes. Reproductive: No mass or other significant abnormality. Cysts and follicles of the ovaries. Other: No abdominal wall hernia or abnormality. Small volume fluid in the right lower quadrant and pelvis. Musculoskeletal: No acute or significant osseous findings. IMPRESSION: 1. The appendix is diffusely thickened and hyperenhancing, measuring approximately 0.9 cm. Adjacent fat stranding. Findings are consistent with acute appendicitis. No evidence of perforation or abscess. 2.  Small volume fluid in the right lower quadrant and pelvis. Electronically Signed   By: Lauralyn Primes M.D.   On: 08/30/2020 16:10    Medications / Allergies: per chart  Antibiotics: Anti-infectives (From admission, onward)   Start     Dose/Rate Route Frequency Ordered Stop   08/31/20 0600  clindamycin (CLEOCIN) IVPB 900 mg       "And" Linked Group Details   900 mg 100 mL/hr over 30 Minutes Intravenous On call to O.R. 08/30/20 1712 09/01/20 0559   08/31/20 0600  gentamicin (GARAMYCIN) 350 mg in dextrose 5 % 100 mL IVPB       "And" Linked Group Details   5 mg/kg  70.3 kg 108.8 mL/hr over 60 Minutes Intravenous On call to O.R. 08/30/20 1712 09/01/20 0559   08/30/20 1715   ciprofloxacin (CIPRO) IVPB 400 mg       "And" Linked Group Details   400 mg 200 mL/hr over 60 Minutes Intravenous  Once 08/30/20 1702     08/30/20 1715  metroNIDAZOLE (FLAGYL) IVPB 500 mg       "And" Linked Group Details   500 mg 100 mL/hr over 60 Minutes Intravenous  Once 08/30/20 1702          Note: Portions of this report may have been transcribed using voice recognition software. Every effort was made to ensure accuracy; however, inadvertent computerized transcription errors may be present.   Any transcriptional errors that result from this process are unintentional.    Ardeth Sportsman, MD, FACS, MASCRS  Esophageal, Gastrointestinal & Colorectal Surgery Robotic and Minimally Invasive Surgery [facility] [address] [place], [place] [address] [phone] Fax [phone] Main/Paging  CONTACT INFORMATION: Weekday (9AM-5PM) concerns: Call [facility] main office at  [phone] Weeknight (5PM-9AM) or Weekend/Holiday concerns: Check [email or web address] for General Surgery [facility] coverage (Please, do not use SecureChat as it is not reliable communication to operating surgeons for immediate patient care)      08/30/2020  5:12 PM

## 2020-08-31 ENCOUNTER — Encounter (HOSPITAL_COMMUNITY): Payer: Self-pay

## 2020-08-31 ENCOUNTER — Other Ambulatory Visit: Payer: Self-pay

## 2020-08-31 MED ORDER — TRAMADOL HCL 50 MG PO TABS: 50.0000 mg | ORAL_TABLET | Freq: Four times a day (QID) | ORAL | 0 refills | Status: AC | PRN

## 2020-08-31 NOTE — Progress Notes (Signed)
Colleen Turner [id number] 07-09-2000  CARE TEAM:  PCP: Pcp, No  Outpatient Care Team: Patient Care Team: Pcp, No as PCP - General  Inpatient Treatment Team: Treatment Team: Attending Provider: Bishop Limbo, MD; Consulting Physician: Montez Morita, Md, MD; Technician: Harlow Ohms, NT; Utilization Review: Jon Billings, RN; Registered Nurse: Sherian Maroon, RN   Problem List:   Principal Problem:   Acute phlegmonous appendicitis s/p lap appendectomy 08/30/2020 Active Problems:   History of acute alcohol intoxication   Anxious mood   Tobacco abuse   1 Day Post-Op  08/30/2020  POST-OPERATIVE DIAGNOSIS:  ACUTE PHLEGMONOUS APPENDICTIS  PROCEDURE:  APPENDECTOMY LAPAROSCOPIC TRANSVERSUS ABDOMINIS PLANE (TAP) BLOCK - BILATERAL  SURGEON:  Ardeth Sportsman, MD    Assessment  Recovering  [facility] Stay = 0 days)  Plan:  -Advance diet.  Stop IV fluids.  IV antibiotics.  -VTE prophylaxis- SCDs, etc -mobilize as tolerated to help recovery  Advance her diet.  Stop IV fluids.  Hopefully can go home this afternoon if continues to feel better  Disposition:  Disposition:  The patient is from: Home  Anticipate discharge to:  Home  Anticipated Date of Discharge is:  April 10,2022    Barriers to discharge:  Pending Clinical improvement (more likely than not)  Patient currently is ALMOST for discharge from the hospital from a surgery standpoint.      20 minutes spent in review, evaluation, examination, counseling, and coordination of care.   I have reviewed this patient's available data, including medical history, events of note, physical examination and test results as part of my evaluation.  A significant portion of that time was spent in counseling.  Care during the described time interval was provided by me.  08/31/2020    Subjective: (Chief complaint)  Soreness under better control.  Walked in hallways.    Tolerating liquids.  Mother in  room.  Objective:  Vital signs:  Vitals:   08/30/20 2323 08/31/20 0019 08/31/20 0154 08/31/20 0618  BP: 113/76 111/72 104/67 112/64  Pulse: 68 67 84 74  Resp: Temp: (!) 97.4 F (36.3 C) 97.9 F (36.6 C) 98.3 F (36.8 C) 98.2 F (36.8 C)  TempSrc: Oral Oral Oral Oral  SpO2: 100% 100% 98% 99%  Weight:      Height:           Intake/Output   Yesterday:  04/09 0701 - 04/10 0700 In: 1774.5 [I.V.:1478.5; IV Piggyback:296] Out: 885 [Urine:875; Blood:10] This shift:  No intake/output data recorded.  Bowel function:  Flatus: No  BM:  No  Drain: (No drain)   Physical Exam:  General: Pt awake/alert in no acute distress Eyes: PERRL, normal EOM.  Sclera clear.  No icterus Neuro: CN II-XII intact w/o focal sensory/motor deficits. Lymph: No head/neck/groin lymphadenopathy Psych:  No delerium/psychosis/paranoia.  Oriented x 4 HENT: Normocephalic, Mucus membranes moist.  No thrush Neck: Supple, No tracheal deviation.  No obvious thyromegaly Chest: No pain to chest wall compression.  Good respiratory excursion.  No audible wheezing CV:  Pulses intact.  Regular rhythm.  No major extremity edema MS: Normal AROM mjr joints.  No obvious deformity  Abdomen: Soft.  Mildy distended.  Mildly tender at incisions only.  No evidence of peritonitis.  No incarcerated hernias.  Ext:  No deformity.  No mjr edema.  No cyanosis Skin: No petechiae / purpurea.  No major sores.  Warm and dry    Results:   Cultures: Recent Results (from the past  720 hour(s))  Resp Panel by RT-PCR (Flu A&B, Covid) Nasopharyngeal Swab     Status: None   Collection Time: 08/30/20  6:01 PM   Specimen: Nasopharyngeal Swab; Nasopharyngeal(NP) swabs in vial transport medium  Result Value Ref Range Status   SARS Coronavirus 2 by RT PCR NEGATIVE NEGATIVE Final    Comment: (NOTE) SARS-CoV-2 target nucleic acids are NOT DETECTED.  The SARS-CoV-2 RNA is generally detectable in upper  respiratory specimens during the acute phase of infection. The lowest concentration of SARS-CoV-2 viral copies this assay can detect is 138 copies/mL. A negative result does not preclude SARS-Cov-2 infection and should not be used as the sole basis for treatment or other patient management decisions. A negative result may occur with  improper specimen collection/handling, submission of specimen other than nasopharyngeal swab, presence of viral mutation(s) within the areas targeted by this assay, and inadequate number of viral copies(<138 copies/mL). A negative result must be combined with clinical observations, patient history, and epidemiological information. The expected result is Negative.  Fact Sheet for Patients:  BloggerCourse.com  Fact Sheet for Healthcare Providers:  SeriousBroker.it  This test is no t yet approved or cleared by the [place] FDA and  has been authorized for detection and/or diagnosis of SARS-CoV-2 by FDA under an Emergency Use Authorization (EUA). This EUA will remain  in effect (meaning this test can be used) for the duration of the COVID-19 declaration under Section 564(b)(1) of the Act, 21 U.S.C.section 360bbb-3(b)(1), unless the authorization is terminated  or revoked sooner.       Influenza A by PCR NEGATIVE NEGATIVE Final   Influenza B by PCR NEGATIVE NEGATIVE Final    Comment: (NOTE) The Xpert Xpress SARS-CoV-2/FLU/RSV plus assay is intended as an aid in the diagnosis of influenza from Nasopharyngeal swab specimens and should not be used as a sole basis for treatment. Nasal washings and aspirates are unacceptable for Xpert Xpress SARS-CoV-2/FLU/RSV testing.  Fact Sheet for Patients: BloggerCourse.com  Fact Sheet for Healthcare Providers: SeriousBroker.it  This test is not yet approved or cleared by the [place] FDA and has been  authorized for detection and/or diagnosis of SARS-CoV-2 by FDA under an Emergency Use Authorization (EUA). This EUA will remain in effect (meaning this test can be used) for the duration of the COVID-19 declaration under Section 564(b)(1) of the Act, 21 U.S.C. section 360bbb-3(b)(1), unless the authorization is terminated or revoked.  Performed at [facility], [address] [place], [place] [address]     Labs: Results for orders placed or performed during the hospital encounter of 08/30/20 (from the past 48 hour(s))  Comprehensive metabolic panel     Status: None   Collection Time: 08/30/20 10:35 AM  Result Value Ref Range   Sodium 139 135 - 145 mmol/L   Potassium 4.3 3.5 - 5.1 mmol/L   Chloride 107 98 - 111 mmol/L   CO2 24 22 - 32 mmol/L   Glucose, Bld 83 70 - 99 mg/dL    Comment: Glucose reference range applies only to samples taken after fasting for at least 8 hours.   BUN 14 6 - 20 mg/dL   Creatinine, Ser 2.29 0.44 - 1.00 mg/dL   Calcium 9.3 8.9 - 79.8 mg/dL   Total Protein 7.4 6.5 - 8.1 g/dL   Albumin 4.0 3.5 - 5.0 g/dL   AST 22 15 - 41 U/L   ALT 13 0 - 44 U/L   Alkaline Phosphatase 42 38 - 126 U/L  Total Bilirubin 1.0 0.3 - 1.2 mg/dL   GFR, Estimated >56 >31 mL/min    Comment: (NOTE) Calculated using the CKD-EPI Creatinine Equation (2021)    Anion gap 8 5 - 15    Comment: Performed at [facility], [address] [place], [place] [address]  CBC with Differential     Status: Abnormal   Collection Time: 08/30/20 10:35 AM  Result Value Ref Range   WBC 7.0 4.0 - 10.5 K/uL   RBC 5.37 (H) 3.87 - 5.11 MIL/uL   Hemoglobin 12.4 12.0 - 15.0 g/dL   HCT 63.7 85.8 - 85.0 %   MCV 72.6 (L) 80.0 - 100.0 fL   MCH 23.1 (L) 26.0 - 34.0 pg   MCHC 31.8 30.0 - 36.0 g/dL   RDW 27.7 41.2 - 87.8 %   Platelets 230 150 - 400 K/uL   nRBC 0.0 0.0 - 0.2 %   Neutrophils Relative % 66 %   Neutro Abs 4.6 1.7 - 7.7 K/uL   Lymphocytes Relative 24 %    Lymphs Abs 1.7 0.7 - 4.0 K/uL   Monocytes Relative 6 %   Monocytes Absolute 0.5 0.1 - 1.0 K/uL   Eosinophils Relative 3 %   Eosinophils Absolute 0.2 0.0 - 0.5 K/uL   Basophils Relative 1 %   Basophils Absolute 0.1 0.0 - 0.1 K/uL   Immature Granulocytes 0 %   Abs Immature Granulocytes 0.02 0.00 - 0.07 K/uL    Comment: Performed at [facility], [address], [place], [place] [address]  Urinalysis, Routine w reflex microscopic     Status: Abnormal   Collection Time: 08/30/20 10:35 AM  Result Value Ref Range   Color, Urine YELLOW YELLOW   APPearance CLEAR CLEAR   Specific Gravity, Urine 1.033 (H) 1.005 - 1.030   pH 5.0 5.0 - 8.0   Glucose, UA NEGATIVE NEGATIVE mg/dL   Hgb urine dipstick NEGATIVE NEGATIVE   Bilirubin Urine NEGATIVE NEGATIVE   Ketones, ur 80 (A) NEGATIVE mg/dL   Protein, ur NEGATIVE NEGATIVE mg/dL   Nitrite NEGATIVE NEGATIVE   Leukocytes,Ua NEGATIVE NEGATIVE    Comment: Performed at [facility], [address], [place], [place] [address]  I-Stat beta hCG blood, ED     Status: None   Collection Time: 08/30/20  2:23 PM  Result Value Ref Range   I-stat hCG, quantitative <5.0 <5 mIU/mL   Comment 3            Comment:   GEST. AGE      CONC.  (mIU/mL)   <=1 WEEK        5 - 50     2 WEEKS       50 - 500     3 WEEKS       100 - 10,000     4 WEEKS     1,000 - 30,000        FEMALE AND NON-PREGNANT FEMALE:     LESS THAN 5 mIU/mL   Resp Panel by RT-PCR (Flu A&B, Covid) Nasopharyngeal Swab     Status: None   Collection Time: 08/30/20  6:01 PM   Specimen: Nasopharyngeal Swab; Nasopharyngeal(NP) swabs in vial transport medium  Result Value Ref Range   SARS Coronavirus 2 by RT PCR NEGATIVE NEGATIVE    Comment: (NOTE) SARS-CoV-2 target nucleic acids are NOT DETECTED.  The SARS-CoV-2 RNA is generally detectable in upper respiratory specimens during the acute phase of infection. The lowest  concentration of SARS-CoV-2 viral copies  this assay can detect is 138 copies/mL. A negative result does not preclude SARS-Cov-2 infection and should not be used as the sole basis for treatment or other patient management decisions. A negative result may occur with  improper specimen collection/handling, submission of specimen other than nasopharyngeal swab, presence of viral mutation(s) within the areas targeted by this assay, and inadequate number of viral copies(<138 copies/mL). A negative result must be combined with clinical observations, patient history, and epidemiological information. The expected result is Negative.  Fact Sheet for Patients:  BloggerCourse.comhttps://www.fda.gov/media/152166/download  Fact Sheet for Healthcare Providers:  SeriousBroker.ithttps://www.fda.gov/media/152162/download  This test is no t yet approved or cleared by the [place] FDA and  has been authorized for detection and/or diagnosis of SARS-CoV-2 by FDA under an Emergency Use Authorization (EUA). This EUA will remain  in effect (meaning this test can be used) for the duration of the COVID-19 declaration under Section 564(b)(1) of the Act, 21 U.S.C.section 360bbb-3(b)(1), unless the authorization is terminated  or revoked sooner.       Influenza A by PCR NEGATIVE NEGATIVE   Influenza B by PCR NEGATIVE NEGATIVE    Comment: (NOTE) The Xpert Xpress SARS-CoV-2/FLU/RSV plus assay is intended as an aid in the diagnosis of influenza from Nasopharyngeal swab specimens and should not be used as a sole basis for treatment. Nasal washings and aspirates are unacceptable for Xpert Xpress SARS-CoV-2/FLU/RSV testing.  Fact Sheet for Patients: BloggerCourse.comhttps://www.fda.gov/media/152166/download  Fact Sheet for Healthcare Providers: SeriousBroker.ithttps://www.fda.gov/media/152162/download  This test is not yet approved or cleared by the [place] FDA and has been authorized for detection and/or diagnosis of SARS-CoV-2 by FDA under an Emergency Use Authorization (EUA). This EUA will  remain in effect (meaning this test can be used) for the duration of the COVID-19 declaration under Section 564(b)(1) of the Act, 21 U.S.C. section 360bbb-3(b)(1), unless the authorization is terminated or revoked.  Performed at [facility], [address] [place], [place] [id number]     Imaging / Studies: CT ABDOMEN PELVIS W CONTRAST  Result Date: 08/30/2020 CLINICAL DATA:  Right lower quadrant abdominal pain for 1 week, worsening today, appendicitis suspected EXAM: CT ABDOMEN AND PELVIS WITH CONTRAST TECHNIQUE: Multidetector CT imaging of the abdomen and pelvis was performed using the standard protocol following bolus administration of intravenous contrast. CONTRAST:  100mL OMNIPAQUE IOHEXOL 300 MG/ML  SOLN COMPARISON:  None. FINDINGS: Lower chest: No acute abnormality. Hepatobiliary: No solid liver abnormality is seen. No gallstones, gallbladder wall thickening, or biliary dilatation. Pancreas: Unremarkable. No pancreatic ductal dilatation or surrounding inflammatory changes. Spleen: Normal in size without significant abnormality. Adrenals/Urinary Tract: Adrenal glands are unremarkable. Kidneys are normal, without renal calculi, solid lesion, or hydronephrosis. Bladder is unremarkable. Stomach/Bowel: Stomach is within normal limits. The appendix is diffusely thickened and hyperenhancing, measuring approximately 0.9 cm (series 4, image 60, series 5, image 35). Adjacent fat stranding. No evidence of bowel wall thickening or distention. Vascular/Lymphatic: No significant vascular findings are present. No enlarged abdominal or pelvic lymph nodes. Reproductive: No mass or other significant abnormality. Cysts and follicles of the ovaries. Other: No abdominal wall hernia or abnormality. Small volume fluid in the right lower quadrant and pelvis. Musculoskeletal: No acute or significant osseous findings. IMPRESSION: 1. The appendix is diffusely thickened and hyperenhancing, measuring  approximately 0.9 cm. Adjacent fat stranding. Findings are consistent with acute appendicitis. No evidence of perforation or abscess. 2.  Small volume fluid in the right lower quadrant and pelvis. Electronically Signed  By: Lauralyn Primes M.D.   On: 08/30/2020 16:10    Medications / Allergies: per chart  Antibiotics: Anti-infectives (From admission, onward)   Start     Dose/Rate Route Frequency Ordered Stop   08/31/20 0600  clindamycin (CLEOCIN) IVPB 900 mg  Status:  Discontinued       "And" Linked Group Details   900 mg 100 mL/hr over 30 Minutes Intravenous On call to O.R. 08/30/20 1712 08/30/20 2030   08/31/20 0400  ciprofloxacin (CIPRO) IVPB 400 mg       "And" Linked Group Details   400 mg 200 mL/hr over 60 Minutes Intravenous Every 12 hours 08/30/20 2253     08/31/20 0400  metroNIDAZOLE (FLAGYL) IVPB 500 mg       "And" Linked Group Details   500 mg 100 mL/hr over 60 Minutes Intravenous Every 8 hours 08/30/20 2253     08/30/20 1815  gentamicin (GARAMYCIN) 350 mg in dextrose 5 % 100 mL IVPB       "And" Linked Group Details   5 mg/kg  70.3 kg 108.8 mL/hr over 60 Minutes Intravenous On call to O.R. 08/30/20 1712 08/30/20 1949   08/30/20 1715  ciprofloxacin (CIPRO) IVPB 400 mg       "And" Linked Group Details   400 mg 200 mL/hr over 60 Minutes Intravenous  Once 08/30/20 1702 08/30/20 1839   08/30/20 1715  metroNIDAZOLE (FLAGYL) IVPB 500 mg       "And" Linked Group Details   500 mg 100 mL/hr over 60 Minutes Intravenous  Once 08/30/20 1702 08/30/20 2010        Note: Portions of this report may have been transcribed using voice recognition software. Every effort was made to ensure accuracy; however, inadvertent computerized transcription errors may be present.   Any transcriptional errors that result from this process are unintentional.    Ardeth Sportsman, MD, FACS, MASCRS  Esophageal, Gastrointestinal & Colorectal Surgery [facility] [address] [place], [place] [address] [phone] Fax [phone] Main/Paging  CONTACT INFORMATION: Weekday (9AM-5PM) concerns: Call [facility] main office at [phone] Weeknight (5PM-9AM) or Weekend/Holiday concerns: Check [email or web address] for General Surgery [facility] coverage (Please, do not use SecureChat as it is not reliable communication to operating surgeons for immediate patient care)      08/31/2020  9:03 AM

## 2020-08-31 NOTE — Anesthesia Postprocedure Evaluation (Signed)
Anesthesia Post Note  Patient: Colleen Turner  Procedure(s) Performed: APPENDECTOMY LAPAROSCOPIC (N/A )     Patient location during evaluation: PACU Anesthesia Type: General Level of consciousness: awake and alert Pain management: pain level controlled Vital Signs Assessment: post-procedure vital signs reviewed and stable Respiratory status: spontaneous breathing, nonlabored ventilation, respiratory function stable and patient connected to nasal cannula oxygen Cardiovascular status: blood pressure returned to baseline and stable Postop Assessment: no apparent nausea or vomiting Anesthetic complications: no   No complications documented.  Last Vitals:  Vitals:   08/31/20 0618 08/31/20 1253  BP: 112/64 (!) 94/51  Pulse: 74 84  Resp: 18 17  Temp: 36.8 C 36.8 C  SpO2: 99% 100%    Last Pain:  Vitals:   08/31/20 1200  TempSrc:   PainSc: 5                  Colleen Turner

## 2020-08-31 NOTE — Progress Notes (Signed)
Pt ambulated to bathroom. Tolerated well.   Pt complained of itching under SCDs. SCDs removed and itching was relieved. Upon assessment, RN noted a scratch on anterior right lower leg. Pt stated she cut herself shaving and scratched off the scab. Will continue to monitor.

## 2020-08-31 NOTE — Progress Notes (Signed)
Assessment unchanged. Pt verbalized understanding of dc instructions through teach back including medications, when to call the doctor and follow up care. Discharged via wc to front entrance accompanied by NT and Mother.

## 2020-09-01 ENCOUNTER — Encounter (HOSPITAL_COMMUNITY): Payer: Self-pay | Admitting: Surgery

## 2020-09-01 NOTE — Discharge Summary (Signed)
Physician Discharge Summary    Patient ID: Colleen Turner MRN: [id number] DOB/AGE: Feb 02, 2001  20 y.o.  Patient Care Team: Pcp, No as PCP - General  Admit date: 08/30/2020  Discharge date: 08/31/2020 Hospital Stay = 0 days    Discharge Diagnoses:  Principal Problem:   Acute phlegmonous appendicitis s/p lap appendectomy 08/30/2020 Active Problems:   History of acute alcohol intoxication   Anxious mood   Tobacco abuse   2 Days Post-Op  08/30/2020  POST-OPERATIVE DIAGNOSIS:ACUTE PHLEGMONOUS APPENDICTIS  PROCEDURE:  APPENDECTOMY LAPAROSCOPIC TRANSVERSUS ABDOMINIS PLANE (TAP) BLOCK - BILATERAL  SURGEON: Ardeth Sportsman, MD  Consults: None  Hospital Course:   Patient found to have appendicitis.  The patient underwent the surgery above.  Postoperatively, the patient gradually mobilized and advanced to a solid diet.  Pain and other symptoms were treated aggressively.    By the time of discharge, the patient was walking well the hallways, eating food, having flatus.  Pain was well-controlled on an oral medications.  Based on meeting discharge criteria and continuing to recover, I felt it was safe for the patient to be discharged from the hospital to further recover with close followup. Postoperative recommendations were discussed in detail.  They are written as well.  Discharged Condition: good  Discharge Exam: Blood pressure (!) 94/51, pulse 84, temperature 98.3 F (36.8 C), resp. rate 17, height  (1.753 m), weight 70.3 kg, last menstrual period 08/13/2020, SpO2 100 %.  General: Pt awake/alert/oriented x4 in No acute distress Eyes: PERRL, normal EOM.  Sclera clear.  No icterus Neuro: CN II-XII intact w/o focal sensory/motor deficits. Lymph: No head/neck/groin lymphadenopathy Psych:  No delerium/psychosis/paranoia HENT: Normocephalic, Mucus membranes moist.  No thrush Neck: Supple, No tracheal deviation Chest: No chest wall pain w good excursion CV:  Pulses intact.   Regular rhythm MS: Normal AROM mjr joints.  No obvious deformity Abdomen: Soft.  Mildly distended.  Mildly tender at incisions only.  No evidence of peritonitis.  No incarcerated hernias. Ext:  SCDs BLE.  No mjr edema.  No cyanosis Skin: No petechiae / purpura   Disposition:    Follow-up Information    [facility] Schedule an appointment as soon as possible for a visit in 3 weeks.   Specialty: General Surgery Why: To follow up after your operation, To follow up after your hospital stay Contact information: [address] [place] [address] [phone]              Discharge disposition: 01-Home or Self Care       Discharge Instructions    Call MD for:   Complete by: As directed    FEVER > 101.5 F  (temperatures < 101.5 F are not significant)   Call MD for:   Complete by: As directed    FEVER > 101.5 F  (temperatures < 101.5 F are not significant)   Call MD for:  extreme fatigue   Complete by: As directed    Call MD for:  extreme fatigue   Complete by: As directed    Call MD for:  persistant dizziness or light-headedness   Complete by: As directed    Call MD for:  persistant dizziness or light-headedness   Complete by: As directed    Call MD for:  persistant nausea and vomiting   Complete by: As directed    Call MD for:  persistant nausea and vomiting   Complete by: As directed    Call MD  for:  redness, tenderness, or signs of infection (pain, swelling, redness, odor or green/yellow discharge around incision site)   Complete by: As directed    Call MD for:  redness, tenderness, or signs of infection (pain, swelling, redness, odor or green/yellow discharge around incision site)   Complete by: As directed    Call MD for:  severe uncontrolled pain   Complete by: As directed    Call MD for:  severe uncontrolled pain   Complete by: As directed    Diet - low sodium heart healthy   Complete by: As directed     Start with a bland diet such as soups, liquids, starchy foods, low fat foods, etc. the first few days at home. Gradually advance to a solid, low-fat, high fiber diet by the end of the first week at home.   Add a fiber supplement to your diet (Metamucil, etc) If you feel full, bloated, or constipated, stay on a full liquid or pureed/blenderized diet for a few days until you feel better and are no longer constipated.   Discharge instructions   Complete by: As directed    See Discharge Instructions If you are not getting better after two weeks or are noticing you are getting worse, contact our office [phone] for further advice.  We may need to adjust your medications, re-evaluate you in the office, send you to the emergency room, or see what other things we can do to help. The clinic staff is available to answer your questions during regular business hours (8:30am-5pm).  Please don't hesitate to call and ask to speak to one of our nurses for clinical concerns.    A surgeon from [facility] is always on call at the hospitals 24 hours/day If you have a medical emergency, go to the nearest emergency room or call 911.   Discharge instructions   Complete by: As directed    See Discharge Instructions If you are not getting better after two weeks or are noticing you are getting worse, contact our office [phone] for further advice.  We may need to adjust your medications, re-evaluate you in the office, send you to the emergency room, or see what other things we can do to help. The clinic staff is available to answer your questions during regular business hours (8:30am-5pm).  Please don't hesitate to call and ask to speak to one of our nurses for clinical concerns.    A surgeon from [facility] is always on call at the hospitals 24 hours/day If you have a medical emergency, go to the nearest emergency room or call 911.   Discharge wound care:   Complete by: As directed     It is good for closed incisions and even open wounds to be washed every day.  Shower every day.  Short baths are fine.  Wash the incisions and wounds clean with soap & water.    You may leave closed incisions open to air if it is dry.   You may cover the incision with clean gauze & replace it after your daily shower for comfort.  TEGADERM:  You have clear gauze band-aid dressings over your closed incision(s).  Remove the dressings 3 days after surgery.   Discharge wound care:   Complete by: As directed    It is good for closed incisions and even open wounds to be washed every day.  Shower every day.  Short baths are fine.  Wash the incisions and wounds clean  with soap & water.    You may leave closed incisions open to air if it is dry.   You may cover the incision with clean gauze & replace it after your daily shower for comfort.  TEGADERM:  You have clear gauze band-aid dressings over your closed incision(s).  Remove the dressings 3 days after surgery.   Driving Restrictions   Complete by: As directed    You may drive when: - you are no longer taking narcotic prescription pain medication - you can comfortably wear a seatbelt - you can safely make sudden turns/stops without pain.   Driving Restrictions   Complete by: As directed    You may drive when: - you are no longer taking narcotic prescription pain medication - you can comfortably wear a seatbelt - you can safely make sudden turns/stops without pain.   Increase activity slowly   Complete by: As directed    Start light daily activities --- self-care, walking, climbing stairs- beginning the day after surgery.  Gradually increase activities as tolerated.  Control your pain to be active.  Stop when you are tired.  Ideally, walk several times a day, eventually an hour a day.   Most people are back to most day-to-day activities in a few weeks.  It takes 4-6 weeks to get back to unrestricted, intense activity. If you can walk 30 minutes  without difficulty, it is safe to try more intense activity such as jogging, treadmill, bicycling, low-impact aerobics, swimming, etc. Save the most intensive and strenuous activity for last (Usually 4-8 weeks after surgery) such as sit-ups, heavy lifting, contact sports, etc.  Refrain from any intense heavy lifting or straining until you are off narcotics for pain control.  You will have off days, but things should improve week-by-week. DO NOT PUSH THROUGH PAIN.  Let pain be your guide: If it hurts to do something, don't do it.   Increase activity slowly   Complete by: As directed    Start light daily activities --- self-care, walking, climbing stairs- beginning the day after surgery.  Gradually increase activities as tolerated.  Control your pain to be active.  Stop when you are tired.  Ideally, walk several times a day, eventually an hour a day.   Most people are back to most day-to-day activities in a few weeks.  It takes 4-6 weeks to get back to unrestricted, intense activity. If you can walk 30 minutes without difficulty, it is safe to try more intense activity such as jogging, treadmill, bicycling, low-impact aerobics, swimming, etc. Save the most intensive and strenuous activity for last (Usually 4-8 weeks after surgery) such as sit-ups, heavy lifting, contact sports, etc.  Refrain from any intense heavy lifting or straining until you are off narcotics for pain control.  You will have off days, but things should improve week-by-week. DO NOT PUSH THROUGH PAIN.  Let pain be your guide: If it hurts to do something, don't do it.   Lifting restrictions   Complete by: As directed    If you can walk 30 minutes without difficulty, it is safe to try more intense activity such as jogging, treadmill, bicycling, low-impact aerobics, swimming, etc. Save the most intensive and strenuous activity for last (Usually 4-8 weeks after surgery) such as sit-ups, heavy lifting, contact sports, etc.   Refrain from any  intense heavy lifting or straining until you are off narcotics for pain control.  You will have off days, but things should improve week-by-week. DO NOT PUSH THROUGH  PAIN.  Let pain be your guide: If it hurts to do something, don't do it.  Pain is your body warning you to avoid that activity for another week until the pain goes down.   Lifting restrictions   Complete by: As directed    If you can walk 30 minutes without difficulty, it is safe to try more intense activity such as jogging, treadmill, bicycling, low-impact aerobics, swimming, etc. Save the most intensive and strenuous activity for last (Usually 4-8 weeks after surgery) such as sit-ups, heavy lifting, contact sports, etc.   Refrain from any intense heavy lifting or straining until you are off narcotics for pain control.  You will have off days, but things should improve week-by-week. DO NOT PUSH THROUGH PAIN.  Let pain be your guide: If it hurts to do something, don't do it.  Pain is your body warning you to avoid that activity for another week until the pain goes down.   May shower / Bathe   Complete by: As directed    May shower / Bathe   Complete by: As directed    May walk up steps   Complete by: As directed    May walk up steps   Complete by: As directed    Remove dressing in 72 hours   Complete by: As directed    Make sure all dressings are removed by the third day after surgery.  Leave incisions open to air.  OK to cover incisions with gauze or bandages as desired   Remove dressing in 72 hours   Complete by: As directed    Make sure all dressings are removed by the third day after surgery.  Leave incisions open to air.  OK to cover incisions with gauze or bandages as desired   Sexual Activity Restrictions   Complete by: As directed    You may have sexual intercourse when it is comfortable. If it hurts to do something, stop.   Sexual Activity Restrictions   Complete by: As directed    You may have sexual intercourse when  it is comfortable. If it hurts to do something, stop.      Allergies as of 08/31/2020      Reactions   Penicillins    Unknown reaction childhood allergy    Azithromycin Rash   Pepto-bismol [bismuth Subsalicylate] Rash      Medication List    TAKE these medications   traMADol 50 MG tablet Commonly known as: ULTRAM Take 1 tablet (50 mg total) by mouth every 6 (six) hours as needed (mild pain).            Discharge Care Instructions  (From admission, onward)         Start     Ordered   08/31/20 0000  Discharge wound care:       Comments: It is good for closed incisions and even open wounds to be washed every day.  Shower every day.  Short baths are fine.  Wash the incisions and wounds clean with soap & water.    You may leave closed incisions open to air if it is dry.   You may cover the incision with clean gauze & replace it after your daily shower for comfort.  TEGADERM:  You have clear gauze band-aid dressings over your closed incision(s).  Remove the dressings 3 days after surgery.   08/31/20 0906   08/30/20 0000  Discharge wound care:       Comments: It is good  for closed incisions and even open wounds to be washed every day.  Shower every day.  Short baths are fine.  Wash the incisions and wounds clean with soap & water.    You may leave closed incisions open to air if it is dry.   You may cover the incision with clean gauze & replace it after your daily shower for comfort.  TEGADERM:  You have clear gauze band-aid dressings over your closed incision(s).  Remove the dressings 3 days after surgery.   08/30/20 2122          Significant Diagnostic Studies:  Results for orders placed or performed during the hospital encounter of 08/30/20 (from the past 72 hour(s))  Comprehensive metabolic panel     Status: None   Collection Time: 08/30/20 10:35 AM  Result Value Ref Range   Sodium 139 135 - 145 mmol/L   Potassium 4.3 3.5 - 5.1 mmol/L   Chloride 107 98 - 111 mmol/L    CO2 24 22 - 32 mmol/L   Glucose, Bld 83 70 - 99 mg/dL    Comment: Glucose reference range applies only to samples taken after fasting for at least 8 hours.   BUN 14 6 - 20 mg/dL   Creatinine, Ser 1.61 0.44 - 1.00 mg/dL   Calcium 9.3 8.9 - 09.6 mg/dL   Total Protein 7.4 6.5 - 8.1 g/dL   Albumin 4.0 3.5 - 5.0 g/dL   AST 22 15 - 41 U/L   ALT 13 0 - 44 U/L   Alkaline Phosphatase 42 38 - 126 U/L   Total Bilirubin 1.0 0.3 - 1.2 mg/dL   GFR, Estimated >04 >54 mL/min    Comment: (NOTE) Calculated using the CKD-EPI Creatinine Equation (2021)    Anion gap 8 5 - 15    Comment: Performed at [facility], [address] [place], [place] [address]  CBC with Differential     Status: Abnormal   Collection Time: 08/30/20 10:35 AM  Result Value Ref Range   WBC 7.0 4.0 - 10.5 K/uL   RBC 5.37 (H) 3.87 - 5.11 MIL/uL   Hemoglobin 12.4 12.0 - 15.0 g/dL   HCT 91.4 78.2 - 95.6 %   MCV 72.6 (L) 80.0 - 100.0 fL   MCH 23.1 (L) 26.0 - 34.0 pg   MCHC 31.8 30.0 - 36.0 g/dL   RDW 21.3 08.6 - 57.8 %   Platelets 230 150 - 400 K/uL   nRBC 0.0 0.0 - 0.2 %   Neutrophils Relative % 66 %   Neutro Abs 4.6 1.7 - 7.7 K/uL   Lymphocytes Relative 24 %   Lymphs Abs 1.7 0.7 - 4.0 K/uL   Monocytes Relative 6 %   Monocytes Absolute 0.5 0.1 - 1.0 K/uL   Eosinophils Relative 3 %   Eosinophils Absolute 0.2 0.0 - 0.5 K/uL   Basophils Relative 1 %   Basophils Absolute 0.1 0.0 - 0.1 K/uL   Immature Granulocytes 0 %   Abs Immature Granulocytes 0.02 0.00 - 0.07 K/uL    Comment: Performed at [facility], [address], [place], [place] [address]  Urinalysis, Routine w reflex microscopic     Status: Abnormal   Collection Time: 08/30/20 10:35 AM  Result Value Ref Range   Color, Urine YELLOW YELLOW   APPearance CLEAR CLEAR   Specific Gravity, Urine 1.033 (H) 1.005 - 1.030   pH 5.0 5.0 - 8.0   Glucose, UA NEGATIVE NEGATIVE mg/dL  Hgb urine dipstick NEGATIVE NEGATIVE   Bilirubin  Urine NEGATIVE NEGATIVE   Ketones, ur 80 (A) NEGATIVE mg/dL   Protein, ur NEGATIVE NEGATIVE mg/dL   Nitrite NEGATIVE NEGATIVE   Leukocytes,Ua NEGATIVE NEGATIVE    Comment: Performed at [facility], [address] [place], [place] [id number]  I-Stat beta hCG blood, ED     Status: None   Collection Time: 08/30/20  2:23 PM  Result Value Ref Range   I-stat hCG, quantitative <5.0 <5 mIU/mL   Comment 3            Comment:   GEST. AGE      CONC.  (mIU/mL)   <=1 WEEK        5 - 50     2 WEEKS       50 - 500     3 WEEKS       100 - 10,000     4 WEEKS     1,000 - 30,000        FEMALE AND NON-PREGNANT FEMALE:     LESS THAN 5 mIU/mL   Resp Panel by RT-PCR (Flu A&B, Covid) Nasopharyngeal Swab     Status: None   Collection Time: 08/30/20  6:01 PM   Specimen: Nasopharyngeal Swab; Nasopharyngeal(NP) swabs in vial transport medium  Result Value Ref Range   SARS Coronavirus 2 by RT PCR NEGATIVE NEGATIVE    Comment: (NOTE) SARS-CoV-2 target nucleic acids are NOT DETECTED.  The SARS-CoV-2 RNA is generally detectable in upper respiratory specimens during the acute phase of infection. The lowest concentration of SARS-CoV-2 viral copies this assay can detect is 138 copies/mL. A negative result does not preclude SARS-Cov-2 infection and should not be used as the sole basis for treatment or other patient management decisions. A negative result may occur with  improper specimen collection/handling, submission of specimen other than nasopharyngeal swab, presence of viral mutation(s) within the areas targeted by this assay, and inadequate number of viral copies(<138 copies/mL). A negative result must be combined with clinical observations, patient history, and epidemiological information. The expected result is Negative.  Fact Sheet for Patients:  BloggerCourse.comhttps://www.fda.gov/media/152166/download  Fact Sheet for Healthcare Providers:  SeriousBroker.ithttps://www.fda.gov/media/152162/download  This test  is no t yet approved or cleared by the [place] FDA and  has been authorized for detection and/or diagnosis of SARS-CoV-2 by FDA under an Emergency Use Authorization (EUA). This EUA will remain  in effect (meaning this test can be used) for the duration of the COVID-19 declaration under Section 564(b)(1) of the Act, 21 U.S.C.section 360bbb-3(b)(1), unless the authorization is terminated  or revoked sooner.       Influenza A by PCR NEGATIVE NEGATIVE   Influenza B by PCR NEGATIVE NEGATIVE    Comment: (NOTE) The Xpert Xpress SARS-CoV-2/FLU/RSV plus assay is intended as an aid in the diagnosis of influenza from Nasopharyngeal swab specimens and should not be used as a sole basis for treatment. Nasal washings and aspirates are unacceptable for Xpert Xpress SARS-CoV-2/FLU/RSV testing.  Fact Sheet for Patients: BloggerCourse.comhttps://www.fda.gov/media/152166/download  Fact Sheet for Healthcare Providers: SeriousBroker.ithttps://www.fda.gov/media/152162/download  This test is not yet approved or cleared by the [place] FDA and has been authorized for detection and/or diagnosis of SARS-CoV-2 by FDA under an Emergency Use Authorization (EUA). This EUA will remain in effect (meaning this test can be used) for the duration of the COVID-19 declaration under Section 564(b)(1) of the Act, 21 U.S.C. section 360bbb-3(b)(1), unless the authorization is terminated or revoked.  Performed at [facility], [address], [place], [place] [address]     CT ABDOMEN PELVIS W CONTRAST  Result Date: 08/30/2020 CLINICAL DATA:  Right lower quadrant abdominal pain for 1 week, worsening today, appendicitis suspected EXAM: CT ABDOMEN AND PELVIS WITH CONTRAST TECHNIQUE: Multidetector CT imaging of the abdomen and pelvis was performed using the standard protocol following bolus administration of intravenous contrast. CONTRAST:  OMNIPAQUE IOHEXOL 300 MG/ML  SOLN COMPARISON:  None. FINDINGS: Lower chest: No  acute abnormality. Hepatobiliary: No solid liver abnormality is seen. No gallstones, gallbladder wall thickening, or biliary dilatation. Pancreas: Unremarkable. No pancreatic ductal dilatation or surrounding inflammatory changes. Spleen: Normal in size without significant abnormality. Adrenals/Urinary Tract: Adrenal glands are unremarkable. Kidneys are normal, without renal calculi, solid lesion, or hydronephrosis. Bladder is unremarkable. Stomach/Bowel: Stomach is within normal limits. The appendix is diffusely thickened and hyperenhancing, measuring approximately 0.9 cm (series 4, image 60, series 5, image 35). Adjacent fat stranding. No evidence of bowel wall thickening or distention. Vascular/Lymphatic: No significant vascular findings are present. No enlarged abdominal or pelvic lymph nodes. Reproductive: No mass or other significant abnormality. Cysts and follicles of the ovaries. Other: No abdominal wall hernia or abnormality. Small volume fluid in the right lower quadrant and pelvis. Musculoskeletal: No acute or significant osseous findings. IMPRESSION: 1. The appendix is diffusely thickened and hyperenhancing, measuring approximately 0.9 cm. Adjacent fat stranding. Findings are consistent with acute appendicitis. No evidence of perforation or abscess. 2.  Small volume fluid in the right lower quadrant and pelvis. Electronically Signed   By: Lauralyn Primes M.D.   On: 08/30/2020 16:10    History reviewed. No pertinent past medical history.  Past Surgical History:  Procedure Laterality Date  . LAPAROSCOPIC APPENDECTOMY N/A 08/30/2020   Procedure: APPENDECTOMY LAPAROSCOPIC;  Surgeon: Karie Soda, MD;  Location: [facility];  Service: General;  Laterality: N/A;    Social History   Socioeconomic History  . Marital status: Single    Spouse name: Not on file  . Number of children: Not on file  . Years of education: Not on file  . Highest education level: Not on file  Occupational History  . Not on file   Tobacco Use  . Smoking status: Current Every Day Smoker    Packs/day: 1.00    Years: 5.00    Pack years: 5.00    Types: Cigarettes  . Smokeless tobacco: Not on file  Substance and Sexual Activity  . Alcohol use: Yes  . Drug use: Not on file  . Sexual activity: Not on file  Other Topics Concern  . Not on file  Social History Narrative  . Not on file   Social Determinants of Health   Financial Resource Strain: Not on file  Food Insecurity: Not on file  Transportation Needs: Not on file  Physical Activity: Not on file  Stress: Not on file  Social Connections: Not on file  Intimate Partner Violence: Not on file    History reviewed. No pertinent family history.  No current facility-administered medications for this encounter.   Current Outpatient Medications  Medication Sig Dispense Refill  . traMADol (ULTRAM) 50 MG tablet Take 1 tablet (50 mg total) by mouth every 6 (six) hours as needed (mild pain). 20 tablet 0     Allergies  Allergen Reactions  . Penicillins     Unknown reaction childhood allergy   . Azithromycin Rash  . Pepto-Bismol [Bismuth Subsalicylate] Rash    Signed: Ardeth Sportsman  Ardeth Sportsman, MD, FACS, MASCRS  Esophageal, Gastrointestinal & Colorectal Surgery Robotic and Minimally Invasive Surgery [facility] [address] [place], [place] [address] [phone] Fax [phone] Main/Paging  CONTACT INFORMATION: Weekday (9AM-5PM) concerns: Call [facility] main office at [phone] Weeknight (5PM-9AM) or Weekend/Holiday concerns: Check [email or web address] for General Surgery [facility] coverage (Please, do not use SecureChat as it is not reliable communication to operating surgeons for immediate patient care)      09/01/2020, 7:36 AM

## 2020-09-03 LAB — SURGICAL PATHOLOGY

## 2020-09-29 ENCOUNTER — Emergency Department (HOSPITAL_COMMUNITY): Payer: BC Managed Care – PPO

## 2020-09-29 ENCOUNTER — Emergency Department (HOSPITAL_COMMUNITY)
Admission: EM | Admit: 2020-09-29 | Discharge: 2020-09-29 | Disposition: A | Discharge status: Home / Self Care | Payer: BC Managed Care – PPO | Attending: Emergency Medicine | Admitting: Emergency Medicine

## 2020-09-29 DIAGNOSIS — S61411A Laceration without foreign body of right hand, initial encounter: Secondary | ICD-10-CM | POA: Insufficient documentation

## 2020-09-29 DIAGNOSIS — F17210 Nicotine dependence, cigarettes, uncomplicated: Secondary | ICD-10-CM | POA: Diagnosis not present

## 2020-09-29 DIAGNOSIS — W260XXA Contact with knife, initial encounter: Secondary | ICD-10-CM | POA: Insufficient documentation

## 2020-09-29 LAB — POC URINE PREG, ED: Preg Test, Ur: NEGATIVE

## 2020-09-29 MED ORDER — OXYCODONE-ACETAMINOPHEN 5-325 MG PO TABS
1.0000 | ORAL_TABLET | Freq: Once | ORAL | Status: AC
Start: 2020-09-29 — End: 2020-09-29
  Administered 2020-09-29: 1 via ORAL
  Filled 2020-09-29: qty 1

## 2020-09-29 MED ORDER — LIDOCAINE HCL 2 % IJ SOLN
5.0000 mL | Freq: Once | INTRAMUSCULAR | Status: AC
  Administered 2020-09-29: 100 mg
  Filled 2020-09-29: qty 20

## 2020-09-29 NOTE — ED Provider Notes (Signed)
[facility]-EMERGENCY DEPT Provider Note   CSN: [id number] Arrival date & time: 09/29/20  0113     History Chief Complaint  Patient presents with  . Extremity Laceration    Colleen Turner is a 20 y.o. female.  HPI Patient presents with laceration of her right hand.  Cut it with a knife while attempting to cut some bushes with a knife.  Cut is between her thumb and second finger.  Pain with movement.  She thinks tetanus should be up-to-date.  Patient hopes she is not pregnant.  Pain with movement of the thumb but no numbness.    No past medical history on file.  Patient Active Problem List   Diagnosis Date Noted  . History of acute alcohol intoxication 08/30/2020  . Anxious mood 08/30/2020  . Tobacco abuse 08/30/2020  . Acute phlegmonous appendicitis s/p lap appendectomy 08/30/2020 08/30/2020    Past Surgical History:  Procedure Laterality Date  . LAPAROSCOPIC APPENDECTOMY N/A 08/30/2020   Procedure: APPENDECTOMY LAPAROSCOPIC;  Surgeon: Karie Soda, MD;  Location: [facility];  Service: General;  Laterality: N/A;     OB History   No obstetric history on file.     No family history on file.  Social History   Tobacco Use  . Smoking status: Current Every Day Smoker    Packs/day: 1.00    Years: 5.00    Pack years: 5.00    Types: Cigarettes  Substance Use Topics  . Alcohol use: Yes    Home Medications Prior to Admission medications   Medication Sig Start Date End Date Taking? Authorizing Provider  traMADol (ULTRAM) 50 MG tablet Take 1 tablet (50 mg total) by mouth every 6 (six) hours as needed (mild pain). 08/31/20   Karie Soda, MD    Allergies    Penicillins, Azithromycin, and Pepto-bismol [bismuth subsalicylate]  Review of Systems   Review of Systems  Constitutional: Negative for appetite change and fever.  Musculoskeletal: Negative for back pain.  Skin: Positive for wound.  Neurological: Negative for weakness and numbness.    Physical  Exam Updated Vital Signs BP 103/69   Pulse 76   Temp 97.6 F (36.4 C)   Resp 20   Ht 5\' 9"  (1.753 m)   Wt 70.8 kg   SpO2 99%   BMI 23.04 kg/m   Physical Exam Vitals reviewed.  HENT:     Head: Atraumatic.  Musculoskeletal:     Comments: Laceration along webspace between first and second finger.  Somewhat deep and muscle visualized.  Approximately 2 to 3 cm long.  Skin:    Capillary Refill: Capillary refill takes less than 2 seconds.  Neurological:     Mental Status: She is alert and oriented to person, place, and time.     Comments: Sensation grossly intact over thumb and able to move thumb.  However does have pain with movement.  Psychiatric:     Comments: Patient is tearful     ED Results / Procedures / Treatments   Labs (all labs ordered are listed, but only abnormal results are displayed) Labs Reviewed  POC URINE PREG, ED    EKG None  Radiology DG Hand Complete Right  Result Date: 09/29/2020 CLINICAL DATA:  Laceration. EXAM: RIGHT HAND - COMPLETE 3+ VIEW COMPARISON:  None. FINDINGS: There is no evidence of fracture or dislocation. There is no evidence of arthropathy or other focal bone abnormality. Soft tissues are unremarkable. No retained radiopaque foreign body. IMPRESSION: Negative. Electronically Signed   By:  Tish Frederickson M.D.   On: 09/29/2020 01:54    Procedures .Marland KitchenLaceration Repair  Date/Time: 09/29/2020 4:57 AM Performed by: Benjiman Core, MD Authorized by: Benjiman Core, MD   Consent:    Consent obtained:  Verbal   Consent given by:  Patient   Risks discussed:  Infection, need for additional repair, nerve damage, poor cosmetic result, pain, retained foreign body, vascular damage and poor wound healing   Alternatives discussed:  Delayed treatment, observation and no treatment Universal protocol:    Procedure explained and questions answered to patient or proxy's satisfaction: yes   Anesthesia:    Anesthesia method:  Local infiltration    Local anesthetic:  Lidocaine 2% w/o epi Laceration details:    Location:  Hand   Hand location: Webspace between thumb and first finger.   Length (cm):  3 Pre-procedure details:    Preparation:  Patient was prepped and draped in usual sterile fashion and imaging obtained to evaluate for foreign bodies Exploration:    Hemostasis achieved with:  Direct pressure   Imaging outcome: foreign body not noted     Wound extent: muscle damage     Contaminated: no   Treatment:    Amount of cleaning:  Standard   Irrigation solution: Irrigated with lidocaine.   Irrigation method:  Pressure wash   Debridement:  None Skin repair:    Repair method:  Sutures   Suture size:  4-0   Suture material:  Prolene   Number of sutures: 3 simple interrupted and 9 running. Approximation:    Approximation:  Close Repair type:    Repair type:  Simple Post-procedure details:    Dressing:  Sterile dressing   Procedure completion:  Tolerated well, no immediate complications     Medications Ordered in ED Medications  oxyCODONE-acetaminophen (PERCOCET/ROXICET) 5-325 MG per tablet 1 tablet (1 tablet Oral Given 09/29/20 0215)  lidocaine (XYLOCAINE) 2 % (with pres) injection 100 mg (100 mg Infiltration Given by Other 09/29/20 1941)    ED Course  I have reviewed the triage vital signs and the nursing notes.  Pertinent labs & imaging results that were available during my care of the patient were reviewed by me and considered in my medical decision making (see chart for details).    MDM Rules/Calculators/A&P                          Patient with laceration of webspace on right thumb.  Closed with both interrupted and running sutures.  Somewhat difficult to suture due to the anatomic location of patient compliance.  Wound cleaned.  Did appear to be some muscle involvement.  With the pain I think it be worth following up with orthopedic/hand to see if she would need other treatment such as physical therapy after the  sutures removed.  Patient is not pregnant.  Discharge home.  Tetanus is up-to-date Final Clinical Impression(s) / ED Diagnoses Final diagnoses:  Laceration of right hand without foreign body, initial encounter    Rx / DC Orders ED Discharge Orders    None       Benjiman Core, MD 09/29/20 [phone]

## 2020-09-29 NOTE — ED Triage Notes (Signed)
Pt came in with c/o R hand lac. Deep laceration between thumb and forefinger. Wrapped and bleeding controlled. Pt states she was trying to cut bushes with a pocket knife to get to her friend's phone.

## 2020-09-29 NOTE — ED Notes (Signed)
Provided pt w/labeled specimen cup for urine collection. ENMiles 

## 2020-09-29 NOTE — Discharge Instructions (Addendum)
You are not pregnant.  Follow-up with orthopedic surgery in about a week.  With the laceration went down in the muscle may require some physical therapy.

## 2020-10-10 ENCOUNTER — Encounter: Payer: Self-pay | Admitting: Orthopaedic Surgery

## 2020-10-10 ENCOUNTER — Other Ambulatory Visit: Payer: Self-pay

## 2020-10-10 ENCOUNTER — Ambulatory Visit (INDEPENDENT_AMBULATORY_CARE_PROVIDER_SITE_OTHER): Payer: BC Managed Care – PPO | Admitting: Orthopaedic Surgery

## 2020-10-10 DIAGNOSIS — S61411A Laceration without foreign body of right hand, initial encounter: Secondary | ICD-10-CM

## 2020-10-10 NOTE — Progress Notes (Signed)
Office Visit Note   Patient: Colleen Turner           Date of Birth: 2000-12-26           MRN: [id number] Visit Date: 10/10/2020              Requested by: No referring provider defined for this encounter. PCP: Pcp, No   Assessment & Plan: Visit Diagnoses:  1. Laceration of right hand without foreign body, initial encounter     Plan: Impression is simple laceration of the right hand to the first webspace.  We removed the sutures and placed Steri-Strips.  Thumb spica brace for in the next couple weeks to support and help with activity.  She should wean the brace as symptoms improve and prevent unnecessary stiffness.  Follow-up as needed.  Follow-Up Instructions: Return if symptoms worsen or fail to improve.   Orders:  No orders of the defined types were placed in this encounter.  No orders of the defined types were placed in this encounter.     Procedures: No procedures performed   Clinical Data: No additional findings.   Subjective: Chief Complaint  Patient presents with  . Right Thumb - Pain    Colleen Turner is a 20 year old female follow-up from the ER for right hand laceration on Sep 28, 2020.  She was cutting bushes and accidentally cut the webspace between the thumb and index finger.  This was evaluated in the ED and the skin was approximated.  She follows up today.  Denies any numbness and tingling to the hand.  Denies any constitutional symptoms.   Review of Systems  Constitutional: Negative.   HENT: Negative.   Eyes: Negative.   Respiratory: Negative.   Cardiovascular: Negative.   Endocrine: Negative.   Musculoskeletal: Negative.   Neurological: Negative.   Hematological: Negative.   Psychiatric/Behavioral: Negative.   All other systems reviewed and are negative.    Objective: Vital Signs: There were no vitals taken for this visit.  Physical Exam Vitals and nursing note reviewed.  Constitutional:      Appearance: She is well-developed.  HENT:      Head: Normocephalic and atraumatic.  Pulmonary:     Effort: Pulmonary effort is normal.  Abdominal:     Palpations: Abdomen is soft.  Musculoskeletal:     Cervical back: Neck supple.  Skin:    General: Skin is warm.     Capillary Refill: Capillary refill takes less than 2 seconds.  Neurological:     Mental Status: She is alert and oriented to person, place, and time.  Psychiatric:        Behavior: Behavior normal.        Thought Content: Thought content normal.        Judgment: Judgment normal.     Ortho Exam Right hand shows intact repair of the laceration in the first webspace.  The thumb and index fingers both have normal capillary refill.  Sensation intact to both fingers.  Flexor and extensor function also intact.  Thumb abduction, palmar abduction, opposition all intact with slight limitation secondary to discomfort. Specialty Comments:  No specialty comments available.  Imaging: No results found.   PMFS History: Patient Active Problem List   Diagnosis Date Noted  . History of acute alcohol intoxication 08/30/2020  . Anxious mood 08/30/2020  . Tobacco abuse 08/30/2020  . Acute phlegmonous appendicitis s/p lap appendectomy 08/30/2020 08/30/2020   History reviewed. No pertinent past medical history.  History reviewed. No pertinent family  history.  Past Surgical History:  Procedure Laterality Date  . LAPAROSCOPIC APPENDECTOMY N/A 08/30/2020   Procedure: APPENDECTOMY LAPAROSCOPIC;  Surgeon: Karie Soda, MD;  Location: [facility];  Service: General;  Laterality: N/A;   Social History   Occupational History  . Not on file  Tobacco Use  . Smoking status: Current Every Day Smoker    Packs/day: 1.00    Years: 5.00    Pack years: 5.00    Types: Cigarettes  . Smokeless tobacco: Not on file  Substance and Sexual Activity  . Alcohol use: Yes  . Drug use: Not on file  . Sexual activity: Not on file

## 2020-10-23 ENCOUNTER — Encounter: Payer: Self-pay | Admitting: Orthopaedic Surgery

## 2020-10-23 ENCOUNTER — Other Ambulatory Visit: Payer: Self-pay

## 2020-10-23 ENCOUNTER — Ambulatory Visit (INDEPENDENT_AMBULATORY_CARE_PROVIDER_SITE_OTHER): Payer: BC Managed Care – PPO | Admitting: Orthopaedic Surgery

## 2020-10-23 DIAGNOSIS — S61411A Laceration without foreign body of right hand, initial encounter: Secondary | ICD-10-CM

## 2020-10-23 NOTE — Progress Notes (Signed)
    Patient: Colleen Turner           Date of Birth: [birth year]           MRN: [id number] Visit Date: 10/23/2020 PCP: Pcp, No   Assessment & Plan:  Chief Complaint:  Chief Complaint  Patient presents with  . Right Hand - Pain, Wound Check   Visit Diagnoses:  1. Laceration of right hand without foreign body, initial encounter     Plan:   Luther Parody returns today for recheck of right hand laceration.  She is doing well overall.  Reports no pain.  The laceration has completely healed.  No signs of infection or drainage.  No neurovascular compromise.  Full range of motion of the thumb.  At this point she may continue to increase activity to the right hand as tolerated.  She may resume her normal activities.  Follow-up as needed.  Follow-Up Instructions: No follow-ups on file.   Orders:  No orders of the defined types were placed in this encounter.  No orders of the defined types were placed in this encounter.   Imaging: No results found.  PMFS History: Patient Active Problem List   Diagnosis Date Noted  . History of acute alcohol intoxication 08/30/2020  . Anxious mood 08/30/2020  . Tobacco abuse 08/30/2020  . Acute phlegmonous appendicitis s/p lap appendectomy 08/30/2020 08/30/2020   History reviewed. No pertinent past medical history.  History reviewed. No pertinent family history.  Past Surgical History:  Procedure Laterality Date  . LAPAROSCOPIC APPENDECTOMY N/A 08/30/2020   Procedure: APPENDECTOMY LAPAROSCOPIC;  Surgeon: Karie Soda, MD;  Location: [facility];  Service: General;  Laterality: N/A;   Social History   Occupational History  . Not on file  Tobacco Use  . Smoking status: Never Smoker  . Smokeless tobacco: Not on file  Substance and Sexual Activity  . Alcohol use: Yes  . Drug use: Not on file  . Sexual activity: Not on file

## 2020-12-24 ENCOUNTER — Encounter (HOSPITAL_COMMUNITY): Payer: Self-pay

## 2020-12-24 ENCOUNTER — Emergency Department (HOSPITAL_COMMUNITY)
Admission: EM | Admit: 2020-12-24 | Discharge: 2020-12-24 | Disposition: A | Discharge status: Home / Self Care | Payer: BC Managed Care – PPO | Attending: Emergency Medicine | Admitting: Emergency Medicine

## 2020-12-24 ENCOUNTER — Other Ambulatory Visit: Payer: Self-pay

## 2020-12-24 ENCOUNTER — Ambulatory Visit (HOSPITAL_COMMUNITY)
Admission: EM | Admit: 2020-12-24 | Discharge: 2020-12-24 | Disposition: A | Discharge status: Home / Self Care | Payer: No Typology Code available for payment source | Source: Ambulatory Visit | Attending: Emergency Medicine | Admitting: Emergency Medicine

## 2020-12-24 DIAGNOSIS — T7421XA Adult sexual abuse, confirmed, initial encounter: Secondary | ICD-10-CM | POA: Insufficient documentation

## 2020-12-24 LAB — HEPATITIS B SURFACE ANTIGEN: Hepatitis B Surface Ag: NONREACTIVE

## 2020-12-24 LAB — COMPREHENSIVE METABOLIC PANEL
ALT: 12 U/L (ref 0–44)
AST: 15 U/L (ref 15–41)
Albumin: 4.4 g/dL (ref 3.5–5.0)
Alkaline Phosphatase: 39 U/L (ref 38–126)
Anion gap: 14 (ref 5–15)
BUN: 9 mg/dL (ref 6–20)
CO2: 26 mmol/L (ref 22–32)
Calcium: 9.5 mg/dL (ref 8.9–10.3)
Chloride: 104 mmol/L (ref 98–111)
Creatinine, Ser: 0.55 mg/dL (ref 0.44–1.00)
GFR, Estimated: >60 mL/min (ref >60)
Glucose, Bld: 83 mg/dL (ref 70–99)
Potassium: 4 mmol/L (ref 3.5–5.1)
Sodium: 144 mmol/L (ref 135–145)
Total Bilirubin: 1.4 mg/dL — ABNORMAL HIGH (ref 0.3–1.2)
Total Protein: 7.6 g/dL (ref 6.5–8.1)

## 2020-12-24 LAB — RAPID HIV SCREEN (HIV 1/2 AB+AG)
HIV 1/2 Antibodies: NONREACTIVE
HIV-1 P24 Antigen - HIV24: NONREACTIVE

## 2020-12-24 LAB — HEPATITIS C ANTIBODY: HCV Ab: NONREACTIVE

## 2020-12-24 LAB — POC URINE PREG, ED: Preg Test, Ur: NEGATIVE

## 2020-12-24 MED ORDER — ELVITEG-COBIC-EMTRICIT-TENOFAF 150-150-200-10 MG PREPACK
1.0000 | ORAL_TABLET | Freq: Once | ORAL | Status: AC
  Administered 2020-12-24: 1 via ORAL
  Filled 2020-12-24: qty 1

## 2020-12-24 MED ORDER — METRONIDAZOLE 500 MG PO TABS
2000.0000 mg | ORAL_TABLET | Freq: Once | ORAL | Status: AC
  Administered 2020-12-24: 2000 mg via ORAL
  Filled 2020-12-24: qty 4

## 2020-12-24 MED ORDER — ULIPRISTAL ACETATE 30 MG PO TABS
30.0000 mg | ORAL_TABLET | Freq: Once | ORAL | Status: AC
  Administered 2020-12-24: 30 mg via ORAL
  Filled 2020-12-24: qty 1

## 2020-12-24 MED ORDER — DOXYCYCLINE HYCLATE 100 MG PO CAPS: 100.0000 mg | ORAL_CAPSULE | Freq: Two times a day (BID) | ORAL | 0 refills | Status: AC

## 2020-12-24 MED ORDER — ONDANSETRON 4 MG PO TBDP
4.0000 mg | ORAL_TABLET | Freq: Once | ORAL | Status: AC
  Administered 2020-12-24: 4 mg via ORAL
  Filled 2020-12-24: qty 1

## 2020-12-24 MED ORDER — ELVITEG-COBIC-EMTRICIT-TENOFAF 150-150-200-10 MG PO TABS: 1.0000 | ORAL_TABLET | Freq: Every day | ORAL | 0 refills | Status: AC

## 2020-12-24 MED ORDER — ELVITEG-COBIC-EMTRICIT-TENOFAF 150-150-200-10 MG PO TABS: 1.0000 | ORAL_TABLET | Freq: Every day | ORAL | 0 refills | Status: DC

## 2020-12-24 MED ORDER — GENTAMICIN SULFATE 40 MG/ML IJ SOLN
240.0000 mg | Freq: Once | INTRAMUSCULAR | Status: AC
  Administered 2020-12-24: 240 mg via INTRAMUSCULAR
  Filled 2020-12-24: qty 6
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
## 2020-12-24 NOTE — ED Notes (Signed)
[facility] nurse at bedside.
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
## 2020-12-25 ENCOUNTER — Other Ambulatory Visit (HOSPITAL_COMMUNITY): Payer: Self-pay

## 2020-12-25 LAB — RPR: RPR Ser Ql: NONREACTIVE

## 2020-12-25 MED ORDER — ELVITEG-COBIC-EMTRICIT-TENOFAF 150-150-200-10 MG PO TABS
ORAL_TABLET | ORAL | 0 refills | Status: AC
  Filled 2020-12-25: qty 30, 30d supply, fill #0

## 2022-06-22 IMAGING — CT CT ABD-PELV W/ CM
2 of 4 series · 16 of 46 positions shown, 18 images · IV contrast (OMNIPAQUE 300)
Comparison: None.

CLINICAL DATA: Right lower quadrant abdominal pain for 1 week,
worsening today, appendicitis suspected

EXAM:
CT ABDOMEN AND PELVIS WITH CONTRAST
TECHNIQUE: Multidetector CT imaging of the abdomen and pelvis was performed
using the standard protocol following bolus administration of
intravenous contrast.
CONTRAST:  100mL OMNIPAQUE IOHEXOL 300 MG/ML  SOLN

[Series 2: axial st · axial · 0.71mm/px · z∈[+1192,+1597]mm · 13 of 91 slices shown, 15 images]
[im 5/91  soft-tissue]
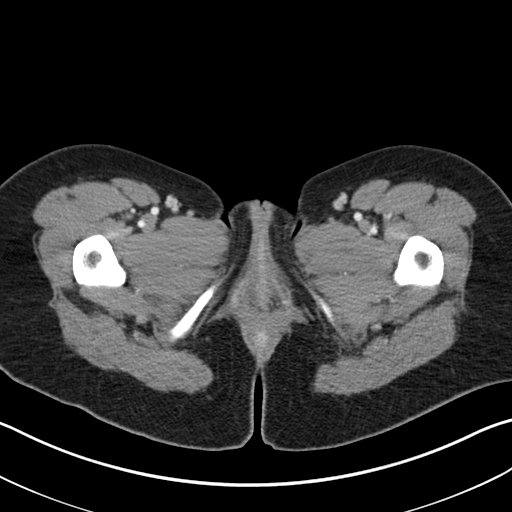
[im 5/91  bone]
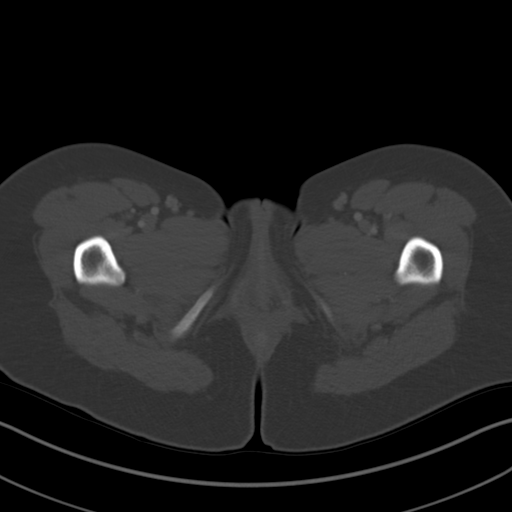
[im 13/91  soft-tissue]
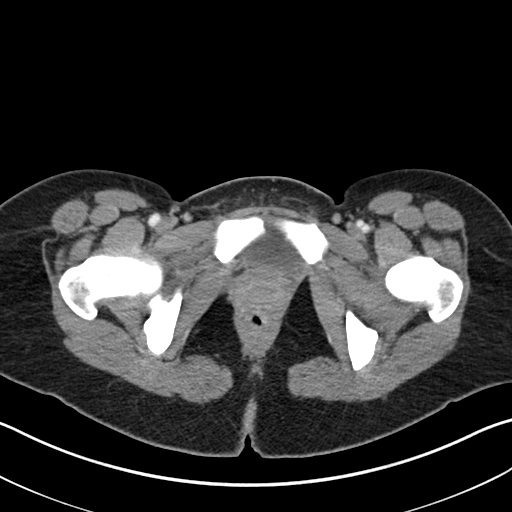
[im 18/91  soft-tissue]
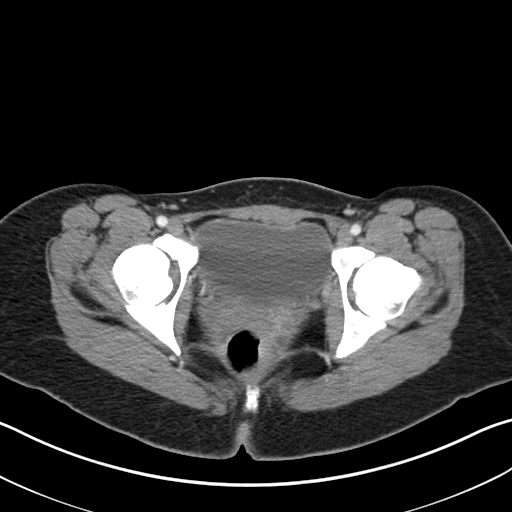
[im 26/91  soft-tissue]
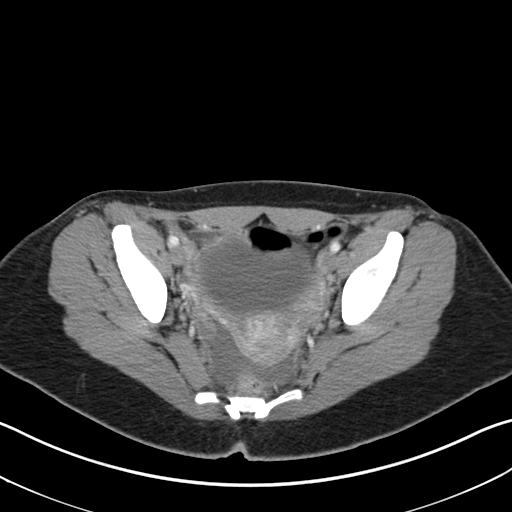
[im 31/91  soft-tissue]
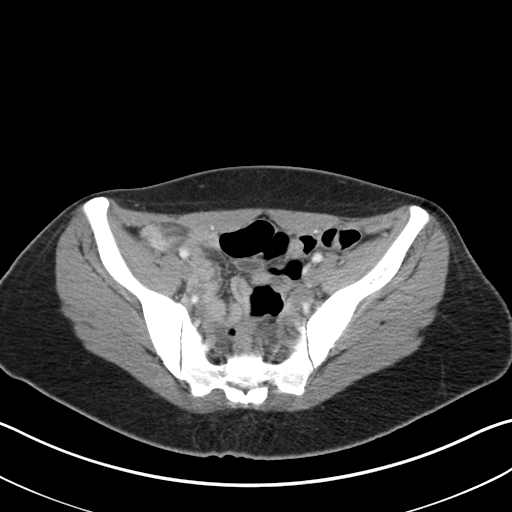
[im 39/91  soft-tissue]
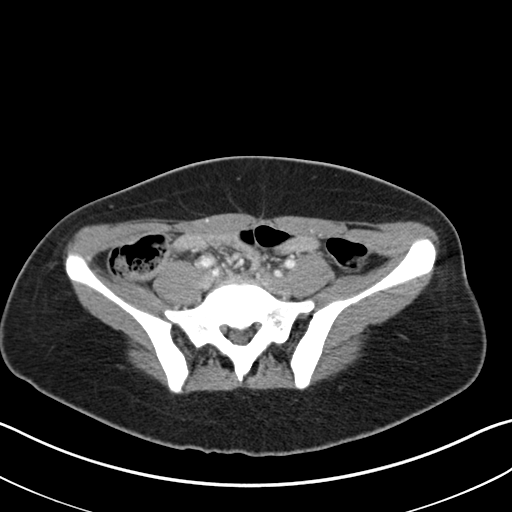
[im 48/91  soft-tissue]
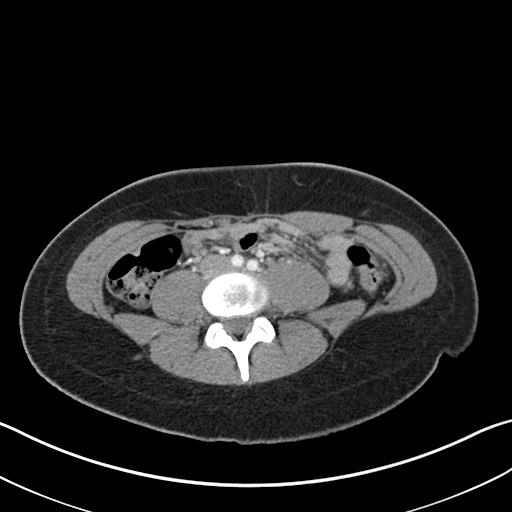
[im 52/91  soft-tissue]
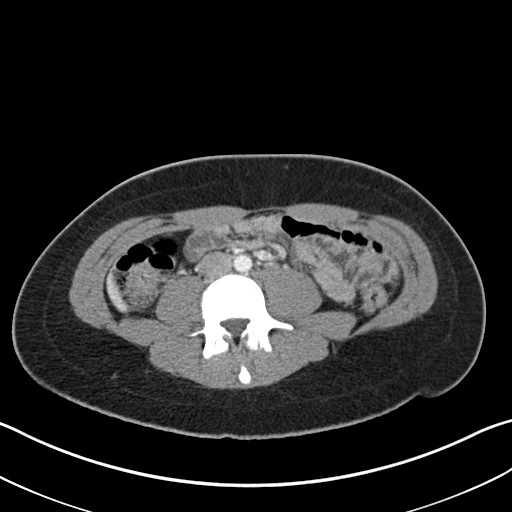
[im 61/91  soft-tissue]
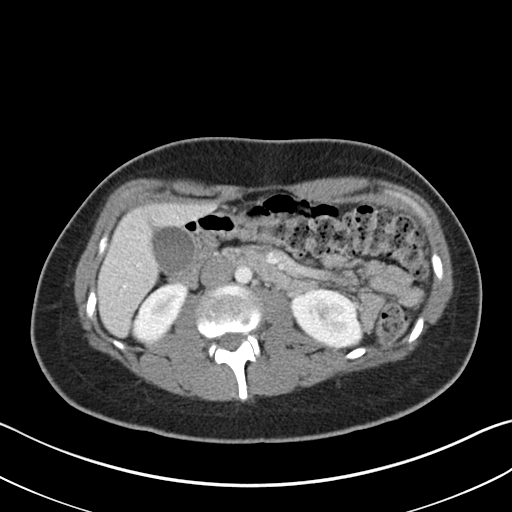
[im 61/91  bone]
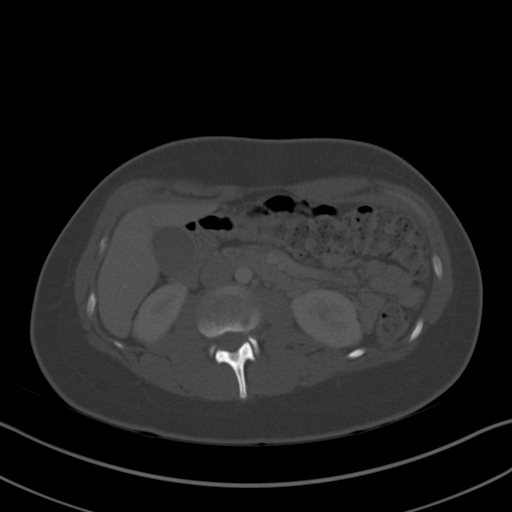
[im 65/91  soft-tissue]
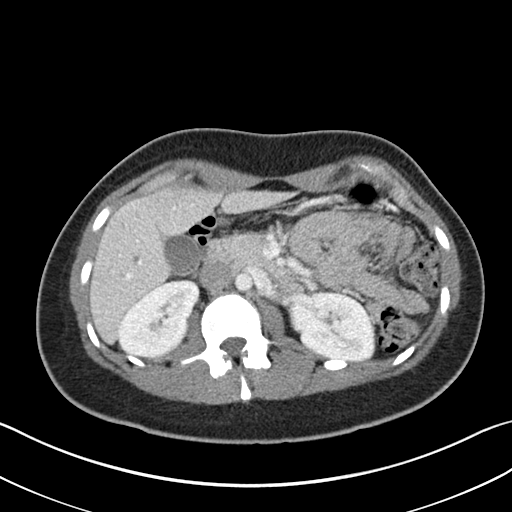
[im 73/91  soft-tissue]
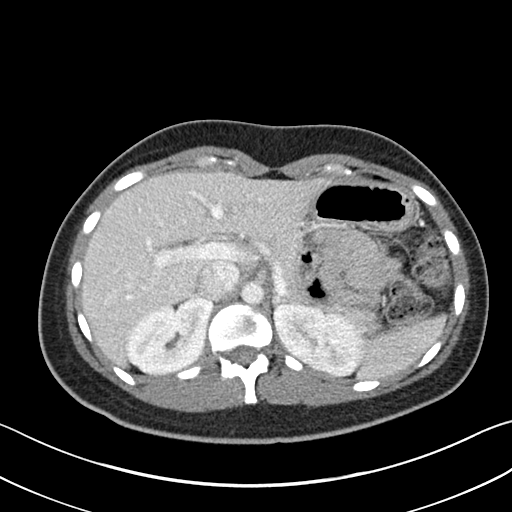
[im 78/91  soft-tissue]
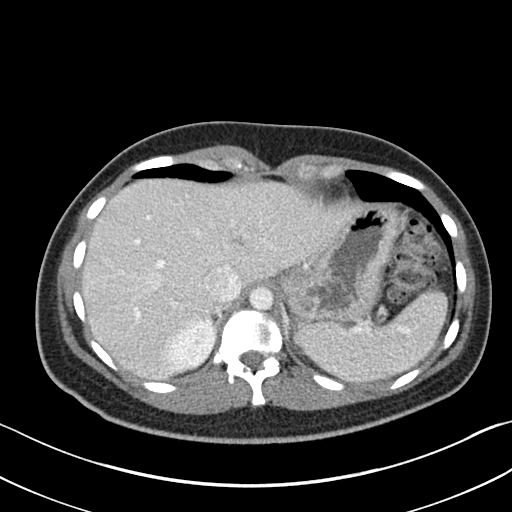
[im 86/91  soft-tissue]
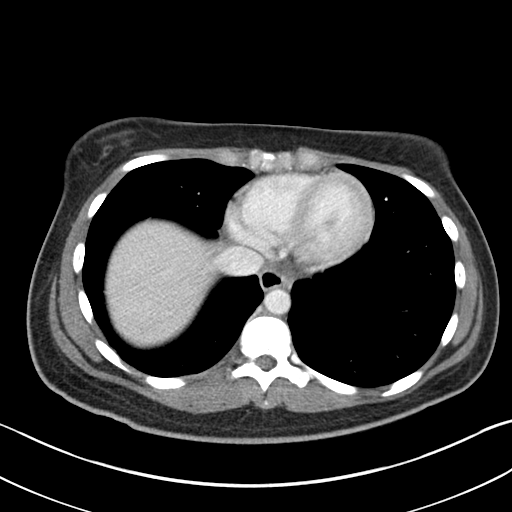

[Series 5: coronal st · coronal · 0.80mm/px · 3 of 113 slices shown]
[im 38/113  soft-tissue]
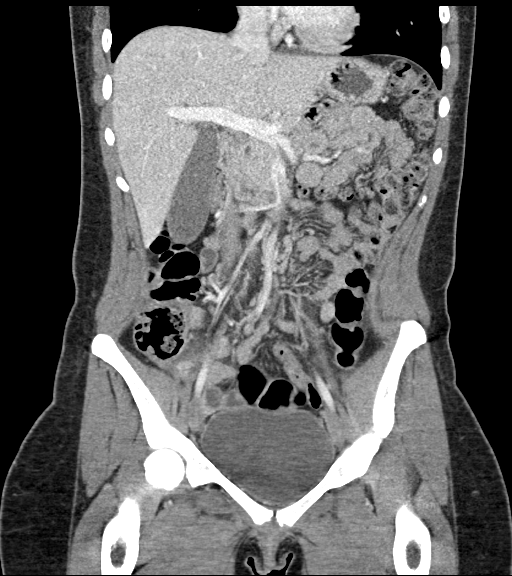
[im 50/113  soft-tissue]
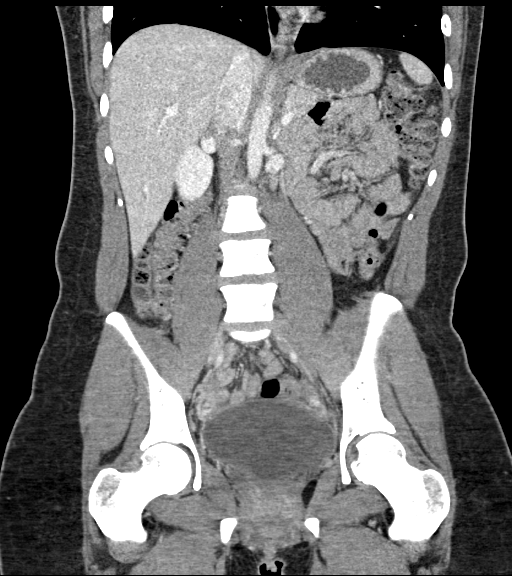
[im 63/113  soft-tissue]
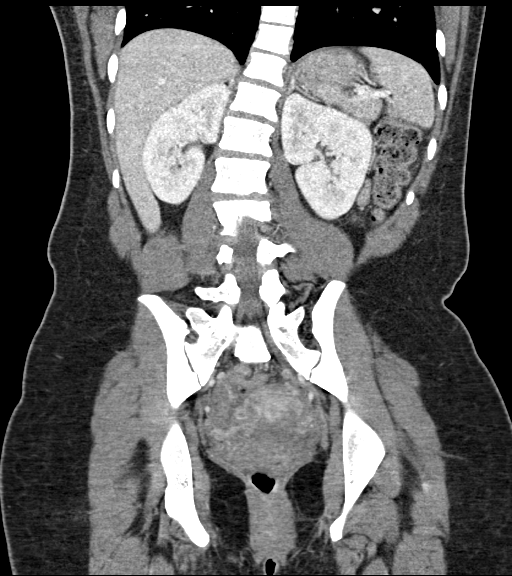

[16 of 46 positions shown; findings below may reference images not displayed]

FINDINGS: Lower chest: No acute abnormality.

Hepatobiliary: No solid liver abnormality is seen. No gallstones,
gallbladder wall thickening, or biliary dilatation.

Pancreas: Unremarkable. No pancreatic ductal dilatation or
surrounding inflammatory changes.

Spleen: Normal in size without significant abnormality.

Adrenals/Urinary Tract: Adrenal glands are unremarkable. Kidneys are
normal, without renal calculi, solid lesion, or hydronephrosis.
Bladder is unremarkable.

Stomach/Bowel: Stomach is within normal limits. The appendix is
diffusely thickened and hyperenhancing, measuring approximately
cm (series 4, image 60, series 5, image 35). Adjacent fat stranding.
No evidence of bowel wall thickening or distention.

Vascular/Lymphatic: No significant vascular findings are present. No
enlarged abdominal or pelvic lymph nodes.

Reproductive: No mass or other significant abnormality. Cysts and
follicles of the ovaries.

Other: No abdominal wall hernia or abnormality. Small volume fluid
in the right lower quadrant and pelvis.

Musculoskeletal: No acute or significant osseous findings.
IMPRESSION: 1. The appendix is diffusely thickened and hyperenhancing, measuring
approximately 0.9 cm. Adjacent fat stranding. Findings are
consistent with acute appendicitis. No evidence of perforation or
abscess.

2.  Small volume fluid in the right lower quadrant and pelvis.
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
